# Patient Record
Sex: Male | Born: 1955 | Race: White | Hispanic: No | Marital: Married | Smoking: Former smoker
Health system: Southern US, Community
[De-identification: ages and names within clinical notes are randomized; demographics above are authoritative.]

## PROBLEM LIST (undated history)

## (undated) DIAGNOSIS — H04123 Dry eye syndrome of bilateral lacrimal glands: Secondary | ICD-10-CM

## (undated) DIAGNOSIS — E119 Type 2 diabetes mellitus without complications: Secondary | ICD-10-CM

## (undated) DIAGNOSIS — I10 Essential (primary) hypertension: Secondary | ICD-10-CM

## (undated) DIAGNOSIS — R3915 Urgency of urination: Secondary | ICD-10-CM

## (undated) DIAGNOSIS — N400 Enlarged prostate without lower urinary tract symptoms: Secondary | ICD-10-CM

## (undated) DIAGNOSIS — Z87442 Personal history of urinary calculi: Secondary | ICD-10-CM

## (undated) DIAGNOSIS — T4145XA Adverse effect of unspecified anesthetic, initial encounter: Secondary | ICD-10-CM

## (undated) DIAGNOSIS — G4730 Sleep apnea, unspecified: Secondary | ICD-10-CM

## (undated) DIAGNOSIS — M1990 Unspecified osteoarthritis, unspecified site: Secondary | ICD-10-CM

## (undated) DIAGNOSIS — H269 Unspecified cataract: Secondary | ICD-10-CM

## (undated) DIAGNOSIS — M2550 Pain in unspecified joint: Secondary | ICD-10-CM

## (undated) DIAGNOSIS — T8859XA Other complications of anesthesia, initial encounter: Secondary | ICD-10-CM

## (undated) DIAGNOSIS — R42 Dizziness and giddiness: Secondary | ICD-10-CM

## (undated) DIAGNOSIS — R350 Frequency of micturition: Secondary | ICD-10-CM

## (undated) DIAGNOSIS — Z8669 Personal history of other diseases of the nervous system and sense organs: Secondary | ICD-10-CM

## (undated) DIAGNOSIS — Z86010 Personal history of colonic polyps: Secondary | ICD-10-CM

## (undated) DIAGNOSIS — K8020 Calculus of gallbladder without cholecystitis without obstruction: Secondary | ICD-10-CM

## (undated) DIAGNOSIS — E785 Hyperlipidemia, unspecified: Secondary | ICD-10-CM

## (undated) DIAGNOSIS — K259 Gastric ulcer, unspecified as acute or chronic, without hemorrhage or perforation: Secondary | ICD-10-CM

## (undated) HISTORY — PX: KNEE ARTHROSCOPY: SUR90

## (undated) HISTORY — PX: OTHER SURGICAL HISTORY: SHX169

## (undated) HISTORY — PX: CYSTOSCOPY: SHX5120

## (undated) HISTORY — PX: ESOPHAGOGASTRODUODENOSCOPY: SHX1529

## (undated) HISTORY — PX: LITHOTRIPSY: SUR834

## (undated) HISTORY — PX: TRIGGER FINGER RELEASE: SHX641

## (undated) HISTORY — PX: SHOULDER ARTHROSCOPY: SHX128

## (undated) HISTORY — PX: COLONOSCOPY: SHX174

## (undated) HISTORY — PX: CARPAL TUNNEL RELEASE: SHX101

---

## 2004-10-09 ENCOUNTER — Ambulatory Visit: Payer: Self-pay | Admitting: Family Medicine

## 2004-10-21 ENCOUNTER — Ambulatory Visit: Payer: Self-pay | Admitting: Family Medicine

## 2004-11-22 ENCOUNTER — Ambulatory Visit (HOSPITAL_COMMUNITY): Admission: RE | Admit: 2004-11-22 | Discharge: 2004-11-22 | Payer: Self-pay | Admitting: Orthopedic Surgery

## 2005-09-08 ENCOUNTER — Ambulatory Visit (HOSPITAL_COMMUNITY): Admission: RE | Admit: 2005-09-08 | Discharge: 2005-09-08 | Payer: Self-pay | Admitting: Urology

## 2007-07-04 IMAGING — CR DG ABDOMEN 1V
2 series · 2 of 2 positions shown · non-contrast
Comparison: none

CLINICAL DATA: Patient with history of a left renal pelvic stone ? for lithotripsy.
 ABDOMEN ? 1 VIEW ? 09/08/05:
 No comparison.

[t abdomen supine (1 of 2)]
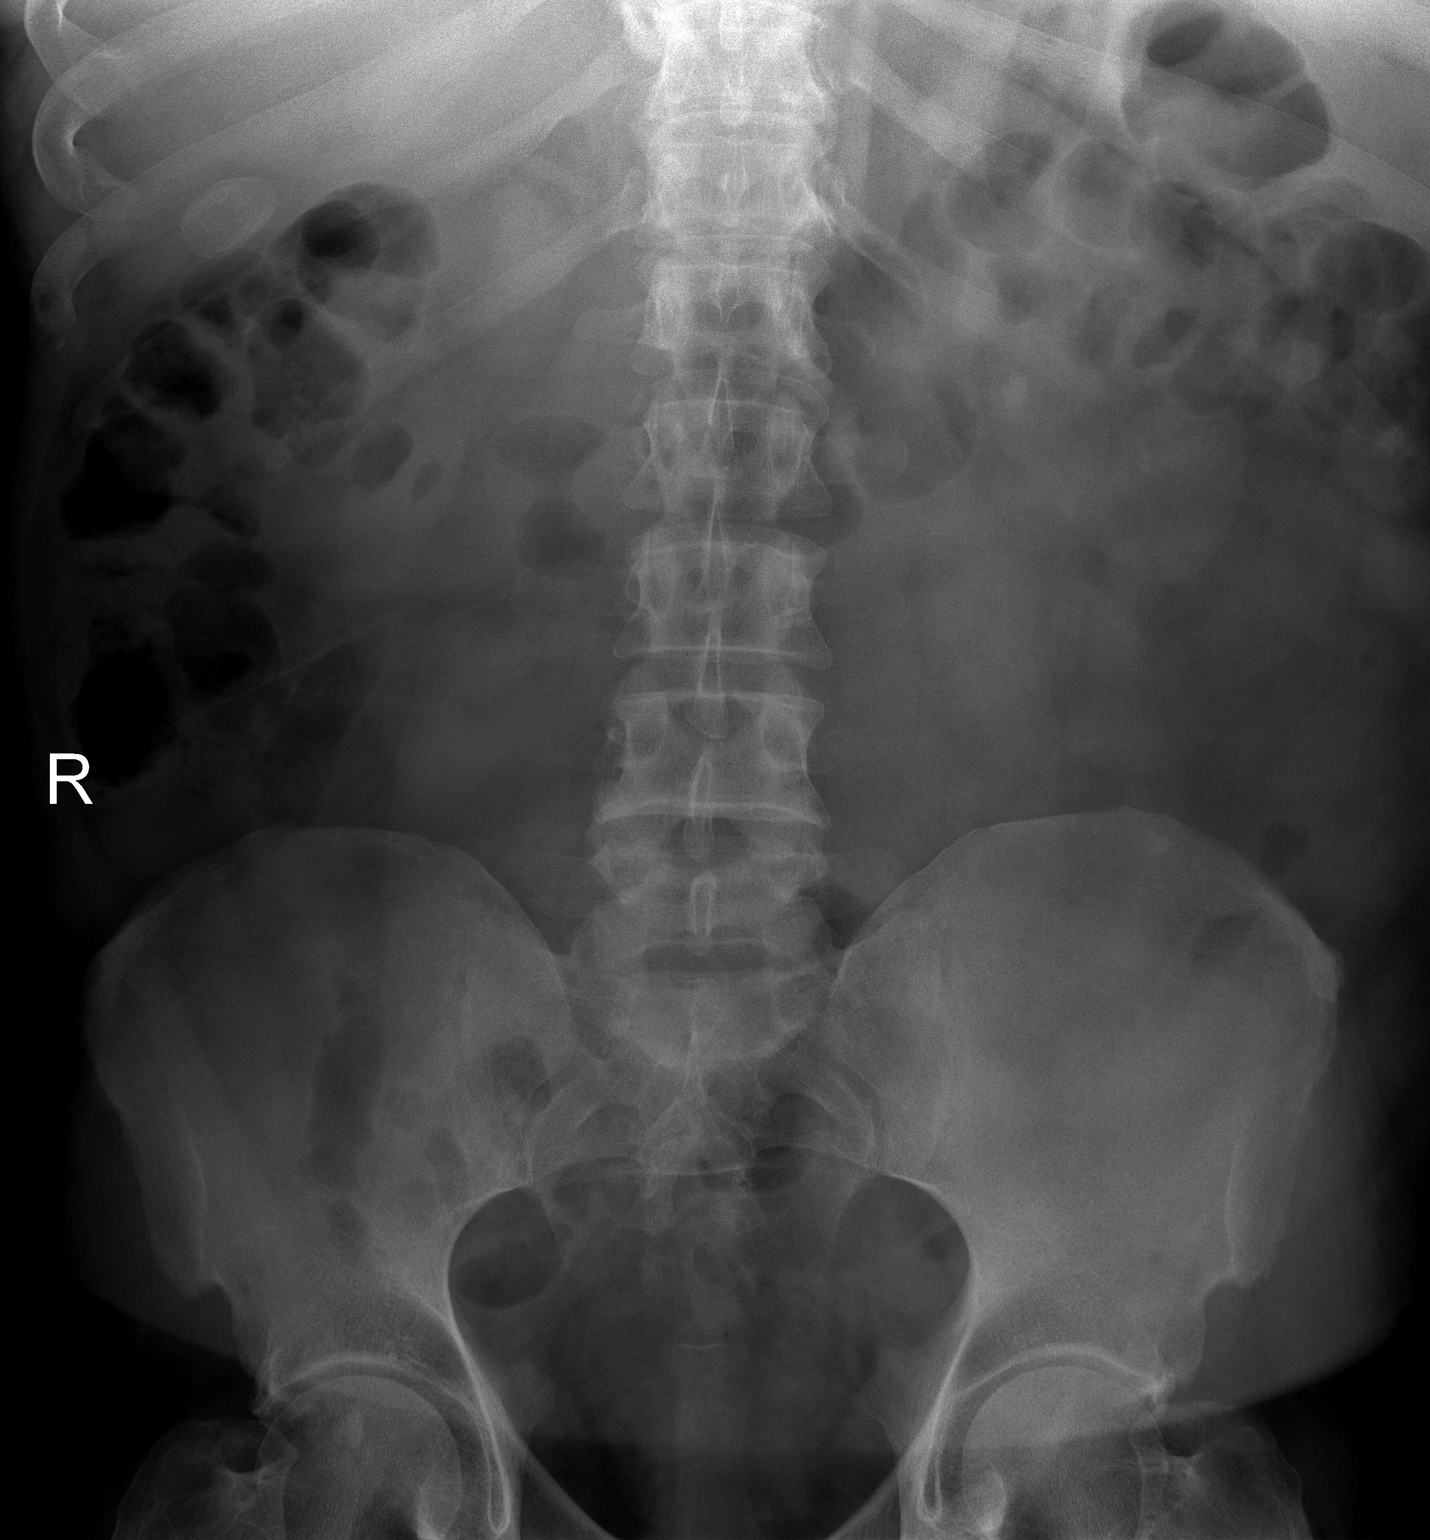

[t abdomen supine (2 of 2)]
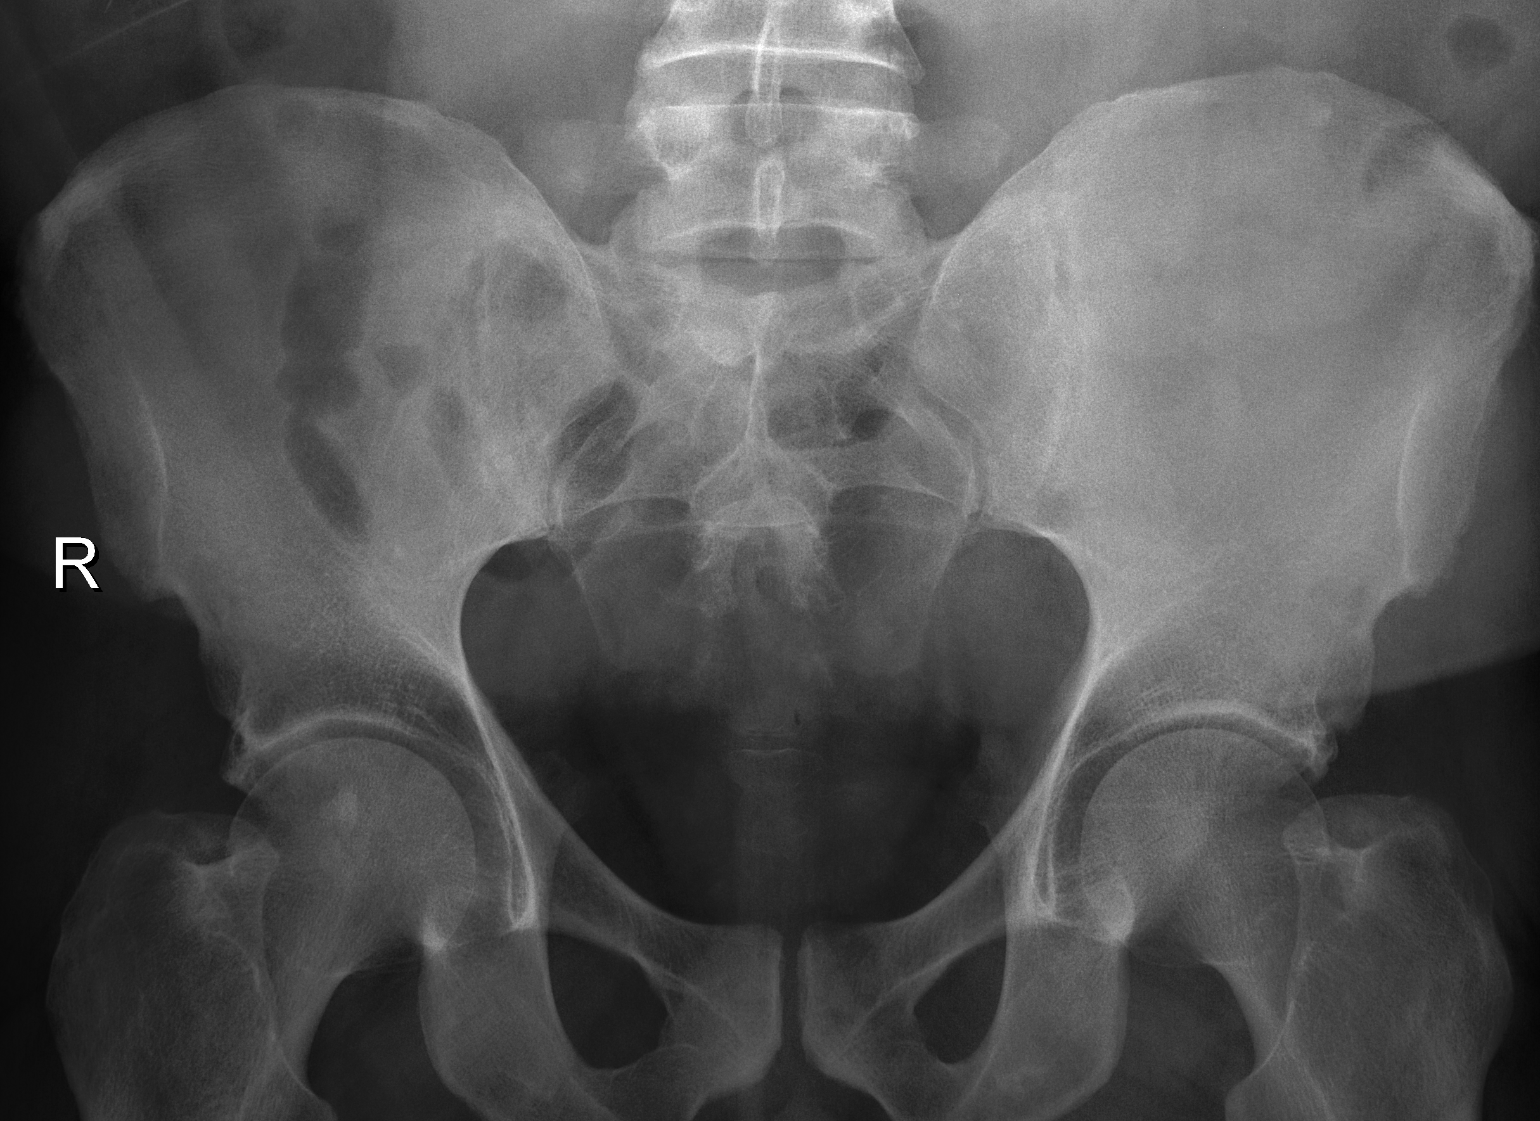

[2 of 2 positions shown; findings below may reference images not displayed]

FINDINGS: AP view of the abdomen reveals a 9 mm calculus in the region of the left renal pelvis.  There is also thought to be a 4 mm calculus in the lower pole on the left.  There is a 2.7 cm calcified gallstone.
IMPRESSION: Left renal calculi.  
 2.7 cm calcified gallstone.
 There is an apparent bone island involving the right femoral head.

## 2009-12-12 ENCOUNTER — Ambulatory Visit (HOSPITAL_BASED_OUTPATIENT_CLINIC_OR_DEPARTMENT_OTHER): Admission: RE | Admit: 2009-12-12 | Discharge: 2009-12-12 | Payer: Self-pay | Admitting: Orthopedic Surgery

## 2010-05-16 LAB — BASIC METABOLIC PANEL
BUN: 16 mg/dL (ref 6–23)
CO2: 30 mEq/L (ref 19–32)
Calcium: 9.3 mg/dL (ref 8.4–10.5)
Chloride: 105 mEq/L (ref 96–112)
Creatinine, Ser: 1.32 mg/dL (ref 0.4–1.5)
GFR calc Af Amer: >60 mL/min (ref >60)
GFR calc non Af Amer: 56 mL/min — ABNORMAL LOW (ref >60)
Glucose, Bld: 204 mg/dL — ABNORMAL HIGH (ref 70–99)
Potassium: 4.6 mEq/L (ref 3.5–5.1)
Sodium: 139 mEq/L (ref 135–145)

## 2010-05-16 LAB — POCT I-STAT, CHEM 8
BUN: 19 mg/dL (ref 6–23)
Calcium, Ion: 1.13 mmol/L (ref 1.12–1.32)
Chloride: 106 mEq/L (ref 96–112)
Creatinine, Ser: 1 mg/dL (ref 0.4–1.5)
Glucose, Bld: 264 mg/dL — ABNORMAL HIGH (ref 70–99)
HCT: 46 % (ref 39.0–52.0)
Hemoglobin: 15.6 g/dL (ref 13.0–17.0)
Potassium: 3.7 mEq/L (ref 3.5–5.1)
Sodium: 141 mEq/L (ref 135–145)
TCO2: 24 mmol/L (ref 0–100)

## 2010-05-16 LAB — GLUCOSE, CAPILLARY: Glucose-Capillary: 220 mg/dL — ABNORMAL HIGH (ref 70–99)

## 2013-03-21 ENCOUNTER — Encounter (HOSPITAL_COMMUNITY): Payer: Self-pay | Admitting: *Deleted

## 2013-03-22 ENCOUNTER — Encounter (HOSPITAL_COMMUNITY): Payer: Self-pay | Admitting: *Deleted

## 2013-03-22 ENCOUNTER — Encounter (HOSPITAL_COMMUNITY): Payer: Self-pay | Admitting: Pharmacy Technician

## 2013-03-23 ENCOUNTER — Other Ambulatory Visit: Payer: Self-pay | Admitting: Urology

## 2013-03-23 NOTE — H&P (Signed)
History of Present Illness     F/u     1-Nephrolithiasis - h/o ESWL and URS/stents. CT Hematuria Oct 2012 was performed which showed a 2 mm left UVJ stone and mild hydronephrosis. He also had stable LLP 3 mm stone. There was an endophytic right renal cyst stable since 2007 (five years) -- I reviewed all images and compared to April 2012 CT images. His BUN 31, Cr 1.2. He passed the left UVJ stone. He felt the stone pass.  -May 2013 - KUB - stable LLP stone. Normal bowel gas pattern.     2- BPH / PCa screening:   -Apr 2012 - started tamsulosin April 2012 for weak stream, frequency. He had a normal DRE and a PSA 0.7.   -May 2013 normal DRE, tapering off alpha blocker. PSA 0.68. PVR - 66 ml        Jan 2015   Interval Hx  For past few weeks patient has had left flank pain. He passed a 5 mm stone a few weeks ago and brought it with him. He passed other smaller stones. He went to ER two days ago with increased left flank pain. CT revealed a 10 mm left proximal ureteral stone (HU 900, SSD 12 cm, visible on scout) and hydro. I reviewed all the images on [facility]. No other stones.  Bun 18, cr 1.0, wbc 7.5    He is staying hydrated - eating and drinking. No fevers. He hasn't needed pain meds.    Past Medical History Problems  1. History of Arthritis (V13.4) 2. History of Gastric ulcer (531.90) 3. History of carpal tunnel syndrome (V12.49) 4. History of diabetes mellitus (V12.29) 5. History of esophageal reflux (V12.79) 6. History of hypercholesterolemia (V12.29) 7. History of hypertension (V12.59) 8. History of sleep apnea (V13.89)  Surgical History Problems  1. History of Cystoscopy With Ureteroscopy With Removal Of Calculus 2. History of Hand Surgery 3. History of Hand Surgery 4. History of Lithotripsy  Current Meds 1. Aspirin 81 MG Oral Tablet;  Therapy: (Recorded:23Apr2012) to Recorded 2. Fish Oil CAPS;  Therapy: (Recorded:19Jan2015) to Recorded 3. Meloxicam 15 MG  Oral Tablet;  Therapy: (Recorded:19Jan2015) to Recorded 4. Metoprolol Succinate ER 25 MG Oral Tablet Extended Release 24 Hour;  Therapy: (Recorded:19Jan2015) to Recorded 5. Multi-Vitamin TABS;  Therapy: (Recorded:19Jan2015) to Recorded 6. Niacin 500 MG Oral Tablet;  Therapy: (Recorded:19Jan2015) to Recorded 7. NovoLIN 70/30 (70-30) 100 UNIT/ML Subcutaneous Suspension;  Therapy: (Recorded:23Apr2012) to Recorded  Allergies Medication  1. Penicillins  Family History Problems  1. Family history of Family Health Status Number Of Children   1 son   1 daughter 2. Family history of Nephrolithiasis : Father 3. Family history of Stroke Syndrome (V17.1) : Mother  Social History Problems  1. Alcohol Use   1-2 beers a week 2. Caffeine Use   4 a day 3. Former smoker (V15.82)   smoked 1 ppd x 24 years 4. Marital History - Currently Married 5. Occupation:   Dispensing optician  Review of Systems Constitutional, cardiovascular, pulmonary, gastrointestinal and psychiatric system(s) were reviewed and pertinent findings if present are noted.    Vitals Vital Signs [Data Includes: Last 1 Day]  Recorded: 19Jan2015 11:03AM  Blood Pressure: 174 / 97 Temperature: 98.6 F Heart Rate: 78  Physical Exam Constitutional: Well nourished and well developed . No acute distress.  ENT:. The ears and nose are normal in appearance.  Neck: The appearance of the neck is normal and no neck mass is present.  Pulmonary: No  respiratory distress and normal respiratory rhythm and effort.  Cardiovascular: Heart rate and rhythm are normal . No peripheral edema.  Abdomen: The abdomen is soft and nontender. No masses are palpated. No CVA tenderness. No hernias are palpable. No hepatosplenomegaly noted.  Lymphatics: The femoral and inguinal nodes are not enlarged or tender.  Skin: Normal skin turgor, no visible rash and no visible skin lesions.  Neuro/Psych:. Mood and affect are appropriate.    Results/Data Urine  [Data Includes: Last 1 Day]   [id number]  COLOR YELLOW   APPEARANCE CLEAR   SPECIFIC GRAVITY 1.015   pH 6.5   GLUCOSE > 1000 mg/dL  BILIRUBIN NEG   KETONE NEG mg/dL  BLOOD MOD   PROTEIN NEG mg/dL  UROBILINOGEN 1 mg/dL  NITRITE NEG   LEUKOCYTE ESTERASE NEG   SQUAMOUS EPITHELIAL/HPF RARE   WBC 3-6 WBC/hpf  RBC 7-10 RBC/hpf  BACTERIA RARE   CRYSTALS NONE SEEN   CASTS NONE SEEN    The following images/tracing/specimen were independently visualized: Marland Kitchen    Assessment Assessed  1. Ureteral stone (592.1)  Plan Health Maintenance  1. UA With REFLEX; [Do Not Release]; Status:Complete;   Done: [id number] 10:42AM Ureteral stone  2. Follow-up Schedule Surgery Office  Follow-up  Status: Hold For - Appointment   Requested for: [id number] 3. URINE CULTURE; Status:Hold For - Specimen/Data Collection,Appointment; Requested  EBX:[id number];   Discussion/Summary Discussed CT imaging - nature R/B of left ESWL, URS/stent or continued MET. He wants to set up ESWL. He's had ESWL before. In fact he stopped his ASA, meloxicam two days ago to prepare. We discussed to call or go to ER if he develops uncontrolled pain, F/C/N/V etc. Urine sent for Cx as a precaution.       Signatures Electronically signed by : Festus Aloe, M.D.; Mar 21 2013 11:40AM EST

## 2013-03-24 ENCOUNTER — Encounter (HOSPITAL_COMMUNITY): Payer: Self-pay | Admitting: *Deleted

## 2013-03-24 ENCOUNTER — Encounter (HOSPITAL_COMMUNITY)
Admission: RE | Disposition: A | Discharge status: Home / Self Care | Payer: Self-pay | Source: Ambulatory Visit | Attending: Urology

## 2013-03-24 ENCOUNTER — Ambulatory Visit (HOSPITAL_COMMUNITY)
Admission: RE | Admit: 2013-03-24 | Discharge: 2013-03-24 | Disposition: A | Discharge status: Home / Self Care | Payer: BC Managed Care – PPO | Source: Ambulatory Visit | Attending: Urology | Admitting: Urology

## 2013-03-24 ENCOUNTER — Ambulatory Visit (HOSPITAL_COMMUNITY): Payer: BC Managed Care – PPO

## 2013-03-24 DIAGNOSIS — N200 Calculus of kidney: Secondary | ICD-10-CM | POA: Insufficient documentation

## 2013-03-24 DIAGNOSIS — Z87891 Personal history of nicotine dependence: Secondary | ICD-10-CM | POA: Insufficient documentation

## 2013-03-24 DIAGNOSIS — Z7901 Long term (current) use of anticoagulants: Secondary | ICD-10-CM | POA: Insufficient documentation

## 2013-03-24 DIAGNOSIS — Z7982 Long term (current) use of aspirin: Secondary | ICD-10-CM | POA: Insufficient documentation

## 2013-03-24 DIAGNOSIS — G4730 Sleep apnea, unspecified: Secondary | ICD-10-CM | POA: Insufficient documentation

## 2013-03-24 DIAGNOSIS — K219 Gastro-esophageal reflux disease without esophagitis: Secondary | ICD-10-CM | POA: Insufficient documentation

## 2013-03-24 DIAGNOSIS — Z794 Long term (current) use of insulin: Secondary | ICD-10-CM | POA: Insufficient documentation

## 2013-03-24 DIAGNOSIS — E7800 Pure hypercholesterolemia, unspecified: Secondary | ICD-10-CM | POA: Insufficient documentation

## 2013-03-24 DIAGNOSIS — Z79899 Other long term (current) drug therapy: Secondary | ICD-10-CM | POA: Insufficient documentation

## 2013-03-24 DIAGNOSIS — I10 Essential (primary) hypertension: Secondary | ICD-10-CM | POA: Insufficient documentation

## 2013-03-24 DIAGNOSIS — E119 Type 2 diabetes mellitus without complications: Secondary | ICD-10-CM | POA: Insufficient documentation

## 2013-03-24 HISTORY — DX: Sleep apnea, unspecified: G47.30

## 2013-03-24 HISTORY — DX: Essential (primary) hypertension: I10

## 2013-03-24 HISTORY — DX: Unspecified osteoarthritis, unspecified site: M19.90

## 2013-03-24 HISTORY — DX: Type 2 diabetes mellitus without complications: E11.9

## 2013-03-24 LAB — GLUCOSE, CAPILLARY
GLUCOSE-CAPILLARY: 268 mg/dL — AB (ref 70–99)
Glucose-Capillary: 194 mg/dL — ABNORMAL HIGH (ref 70–99)
Glucose-Capillary: 154 mg/dL — ABNORMAL HIGH (ref 70–99)

## 2013-03-24 SURGERY — LITHOTRIPSY, ESWL
Anesthesia: LOCAL | Laterality: Left

## 2013-03-24 MED ORDER — DIPHENHYDRAMINE HCL 25 MG PO CAPS
25.0000 mg | ORAL_CAPSULE | ORAL | Status: AC
  Administered 2013-03-24: 25 mg via ORAL
  Filled 2013-03-24: qty 1

## 2013-03-24 MED ORDER — MELOXICAM 15 MG PO TABS: 15.0000 mg | ORAL_TABLET | Freq: Every day | ORAL | Status: AC

## 2013-03-24 MED ORDER — ASPIRIN EC 81 MG PO TBEC: 81.0000 mg | DELAYED_RELEASE_TABLET | Freq: Every day | ORAL | Status: DC

## 2013-03-24 MED ORDER — SODIUM CHLORIDE 0.9 % IV SOLN
INTRAVENOUS | Status: DC
  Administered 2013-03-24: 10:00:00 via INTRAVENOUS

## 2013-03-24 MED ORDER — CIPROFLOXACIN HCL 500 MG PO TABS
500.0000 mg | ORAL_TABLET | ORAL | Status: AC
  Administered 2013-03-24: 500 mg via ORAL
  Filled 2013-03-24: qty 1

## 2013-03-24 MED ORDER — OXYCODONE-ACETAMINOPHEN 5-325 MG PO TABS: 1.0000 | ORAL_TABLET | ORAL | Status: DC | PRN

## 2013-03-24 MED ORDER — CIPROFLOXACIN HCL 500 MG PO TABS: 500.0000 mg | ORAL_TABLET | Freq: Two times a day (BID) | ORAL | Status: DC

## 2013-03-24 MED ORDER — DIAZEPAM 5 MG PO TABS
10.0000 mg | ORAL_TABLET | ORAL | Status: AC
  Administered 2013-03-24: 10 mg via ORAL
  Filled 2013-03-24: qty 2

## 2013-03-24 MED ORDER — DEXTROSE-NACL 5-0.45 % IV SOLN
INTRAVENOUS | Status: DC
  Administered 2013-03-24: 09:00:00 via INTRAVENOUS

## 2013-03-24 NOTE — Op Note (Signed)
See [facility] op note

## 2013-03-24 NOTE — Progress Notes (Signed)
Patient was brought back from mobile lithotripsy unit as Dr Junious Silk called to surgery for emergency. RN explains to pt that estimated time of ESWL will be between 2-4 PM today. He is aggravated; however, he understands the rationale for having to wait. Emotional support given.

## 2013-03-24 NOTE — Progress Notes (Signed)
Left flank side pink circular area, red at center, post lithotripsy

## 2013-03-24 NOTE — Discharge Instructions (Signed)
Please follow [facility] d/c instructions.

## 2013-03-24 NOTE — Interval H&P Note (Signed)
History and Physical Interval Note:  03/24/2013 3:26 PM  Austin Robertson  has presented today for surgery, with the diagnosis of LEFT URETERAL STONE  The various methods of treatment have been discussed with the patient and family. After consideration of risks, benefits and other options for treatment, the patient has consented to  Procedure(s): LEFT EXTRACORPOREAL SHOCK WAVE LITHOTRIPSY (ESWL) (Left) as a surgical intervention .  The patient's history has been reviewed, patient examined, no change in status, stable for surgery.  I have reviewed the patient's chart and labs.  Questions were answered to the patient's satisfaction.  Pt has been well. No fever, dysuria. No stone passage. Stone remains left prox ureter on KUB.    Fredricka Bonine

## 2013-04-26 ENCOUNTER — Encounter (HOSPITAL_COMMUNITY): Payer: Self-pay | Admitting: Pharmacy Technician

## 2013-04-27 ENCOUNTER — Encounter (HOSPITAL_COMMUNITY): Payer: Self-pay

## 2013-04-27 ENCOUNTER — Encounter (HOSPITAL_COMMUNITY)
Admission: RE | Admit: 2013-04-27 | Discharge: 2013-04-27 | Disposition: A | Discharge status: Home / Self Care | Payer: BC Managed Care – PPO | Source: Ambulatory Visit | Attending: Orthopedic Surgery | Admitting: Orthopedic Surgery

## 2013-04-27 ENCOUNTER — Encounter (HOSPITAL_COMMUNITY)
Admission: RE | Admit: 2013-04-27 | Discharge: 2013-04-27 | Disposition: A | Discharge status: Home / Self Care | Payer: BC Managed Care – PPO | Source: Ambulatory Visit | Attending: Anesthesiology | Admitting: Anesthesiology

## 2013-04-27 DIAGNOSIS — Z01812 Encounter for preprocedural laboratory examination: Secondary | ICD-10-CM | POA: Insufficient documentation

## 2013-04-27 DIAGNOSIS — Z01810 Encounter for preprocedural cardiovascular examination: Secondary | ICD-10-CM | POA: Insufficient documentation

## 2013-04-27 DIAGNOSIS — Z01818 Encounter for other preprocedural examination: Secondary | ICD-10-CM | POA: Insufficient documentation

## 2013-04-27 HISTORY — DX: Pain in unspecified joint: M25.50

## 2013-04-27 HISTORY — DX: Gastric ulcer, unspecified as acute or chronic, without hemorrhage or perforation: K25.9

## 2013-04-27 HISTORY — DX: Calculus of gallbladder without cholecystitis without obstruction: K80.20

## 2013-04-27 HISTORY — DX: Other complications of anesthesia, initial encounter: T88.59XA

## 2013-04-27 HISTORY — DX: Personal history of urinary calculi: Z87.442

## 2013-04-27 HISTORY — DX: Personal history of colonic polyps: Z86.010

## 2013-04-27 HISTORY — DX: Dry eye syndrome of bilateral lacrimal glands: H04.123

## 2013-04-27 HISTORY — DX: Hyperlipidemia, unspecified: E78.5

## 2013-04-27 HISTORY — DX: Adverse effect of unspecified anesthetic, initial encounter: T41.45XA

## 2013-04-27 HISTORY — DX: Frequency of micturition: R35.0

## 2013-04-27 HISTORY — DX: Benign prostatic hyperplasia without lower urinary tract symptoms: N40.0

## 2013-04-27 HISTORY — DX: Personal history of other diseases of the nervous system and sense organs: Z86.69

## 2013-04-27 HISTORY — DX: Unspecified cataract: H26.9

## 2013-04-27 HISTORY — DX: Urgency of urination: R39.15

## 2013-04-27 HISTORY — DX: Dizziness and giddiness: R42

## 2013-04-27 LAB — CBC WITH DIFFERENTIAL/PLATELET
Band Neutrophils: 0 % (ref 0–10)
Basophils Absolute: 0 10*3/uL (ref 0.0–0.1)
Basophils Relative: 0 % (ref 0–1)
Blasts: 0 %
EOS ABS: 0.1 10*3/uL (ref 0.0–0.7)
EOS PCT: 2 % (ref 0–5)
HCT: 44.4 % (ref 39.0–52.0)
Hemoglobin: 16.1 g/dL (ref 13.0–17.0)
Lymphocytes Relative: 22 % (ref 12–46)
Lymphs Abs: 1.1 10*3/uL (ref 0.7–4.0)
MCH: 30.1 pg (ref 26.0–34.0)
MCHC: 36.3 g/dL — ABNORMAL HIGH (ref 30.0–36.0)
MCV: 83.1 fL (ref 78.0–100.0)
MYELOCYTES: 1 %
Metamyelocytes Relative: 0 %
Monocytes Absolute: 0.4 10*3/uL (ref 0.1–1.0)
Monocytes Relative: 7 % (ref 3–12)
NEUTROS ABS: 3.5 10*3/uL (ref 1.7–7.7)
NEUTROS PCT: 68 % (ref 43–77)
Platelets: 102 10*3/uL — ABNORMAL LOW (ref 150–400)
Promyelocytes Absolute: 0 %
RBC: 5.34 MIL/uL (ref 4.22–5.81)
RDW: 13.3 % (ref 11.5–15.5)
WBC: 5.1 10*3/uL (ref 4.0–10.5)
nRBC: 0 /100 WBC

## 2013-04-27 LAB — COMPREHENSIVE METABOLIC PANEL
ALT: 76 U/L — ABNORMAL HIGH (ref 0–53)
AST: 58 U/L — ABNORMAL HIGH (ref 0–37)
Albumin: 3.4 g/dL — ABNORMAL LOW (ref 3.5–5.2)
Alkaline Phosphatase: 150 U/L — ABNORMAL HIGH (ref 39–117)
BILIRUBIN TOTAL: 0.7 mg/dL (ref 0.3–1.2)
BUN: 16 mg/dL (ref 6–23)
CHLORIDE: 100 meq/L (ref 96–112)
CO2: 25 mEq/L (ref 19–32)
Calcium: 9.6 mg/dL (ref 8.4–10.5)
Creatinine, Ser: 0.9 mg/dL (ref 0.50–1.35)
GFR calc non Af Amer: >90 mL/min (ref >90)
Glucose, Bld: 322 mg/dL — ABNORMAL HIGH (ref 70–99)
Potassium: 4.8 mEq/L (ref 3.7–5.3)
Sodium: 138 mEq/L (ref 137–147)
Total Protein: 7.1 g/dL (ref 6.0–8.3)

## 2013-04-27 LAB — PROTIME-INR
INR: 1.05 (ref 0.00–1.49)
PROTHROMBIN TIME: 13.5 s (ref 11.6–15.2)

## 2013-04-27 LAB — APTT: aPTT: 27 seconds (ref 24–37)

## 2013-04-27 MED ORDER — CHLORHEXIDINE GLUCONATE 4 % EX LIQD: 60.0000 mL | Freq: Once | CUTANEOUS | Status: DC

## 2013-04-27 NOTE — Progress Notes (Signed)
Pt doesn't have a cardiologist  Stress test in epic under media tab  From 2011  Denies ever having an echo or heart cath  [facility] is [place] and sees  Laverna Peace NP  Denies ekg or cxr in past yr

## 2013-04-27 NOTE — Pre-Procedure Instructions (Signed)
Austin Robertson  04/27/2013   Your procedure is scheduled on:  Thurs, Mar 5 @ 1:00 PM  Report to [name] Stay Entrance A  at 11:00 AM.  Call this number if you have problems the morning of surgery: [phone]   Remember:   Do not eat food or drink liquids after midnight.   Take these medicines the morning of surgery with A SIP OF WATER: Pain Pill(if needed) and Metoprolol(Lopressor)               Stop taking your Aspirin,Fish Oil,and Meloxicam. No Goody's,BC's,Aleve,Ibuprofen,or any Herbal Medications   Do not wear jewelry  Do not wear lotions, powders, or colognes.   Men may shave face and neck.  Do not bring valuables to the hospital.  [name] is not responsible                  for any belongings or valuables.               Contacts, dentures or bridgework may not be worn into surgery.  Leave suitcase in the car. After surgery it may be brought to your room.  For patients admitted to the hospital, discharge time is determined by your                treatment team.               Patients discharged the day of surgery will not be allowed to drive  home.    Special Instructions:  [name] - Preparing for Surgery  Before surgery, you can play an important role.  Because skin is not sterile, your skin needs to be as free of germs as possible.  You can reduce the number of germs on you skin by washing with CHG (chlorahexidine gluconate) soap before surgery.  CHG is an antiseptic cleaner which kills germs and bonds with the skin to continue killing germs even after washing.  Please DO NOT use if you have an allergy to CHG or antibacterial soaps.  If your skin becomes reddened/irritated stop using the CHG and inform your nurse when you arrive at Short Stay.  Do not shave (including legs and underarms) for at least 48 hours prior to the first CHG shower.  You may shave your face.  Please follow these instructions carefully:   1.  Shower with CHG Soap the night before  surgery and the                                morning of Surgery.  2.  If you choose to wash your hair, wash your hair first as usual with your       normal shampoo.  3.  After you shampoo, rinse your hair and body thoroughly to remove the                      Shampoo.  4.  Use CHG as you would any other liquid soap.  You can apply chg directly       to the skin and wash gently with scrungie or a clean washcloth.  5.  Apply the CHG Soap to your body ONLY FROM THE NECK DOWN.        Do not use on open wounds or open sores.  Avoid contact with your eyes,       ears, mouth and genitals (private parts).  Wash genitals (private parts)       with your normal soap.  6.  Wash thoroughly, paying special attention to the area where your surgery        will be performed.  7.  Thoroughly rinse your body with warm water from the neck down.  8.  DO NOT shower/wash with your normal soap after using and rinsing off       the CHG Soap.  9.  Pat yourself dry with a clean towel.            10.  Wear clean pajamas.            11.  Place clean sheets on your bed the night of your first shower and do not        sleep with pets.  Day of Surgery  Do not apply any lotions/deoderants the morning of surgery.  Please wear clean clothes to the hospital/surgery center.     Please read over the following fact sheets that you were given: Pain Booklet, Coughing and Deep Breathing and Surgical Site Infection Prevention

## 2013-04-27 NOTE — Progress Notes (Signed)
Average fasting blood sugar 250(pt states elevated since kidney stones)

## 2013-04-28 ENCOUNTER — Encounter (HOSPITAL_COMMUNITY): Payer: Self-pay

## 2013-04-28 NOTE — Progress Notes (Signed)
Anesthesia chart review: Patient is a 58 year old male scheduled for left shoulder arthroscopy with subacromial decompression, distal clavicle resection and rotator cuff repair on 05/05/2013 by Dr. Onnie Graham.    History includes former smoker, DM2 on insulin, HTN, HLD, OSA with CPAP use, BPH, migraines, nephrolithiasis, gastric ulcers, cataract extracts, vertigo, BMI is 38.49 consistent with obesity. For anesthesia history, he reported that he woke up during basket retrieval for kidney stones many years ago.  His anesthesiologist told him that he was resistant and that it took more anesthesia to get him properly anesthetized. PCP is Laverna Peace, NP with [facility].   EKG on 04/27/13 showed NSR, possible LAE, non-specific T wave abnormality.  Nuclear stress test on 04/23/09 (done at [facility]; scanned under Media Tab, Correspondence 12/12/09) showed: No evidence of ischemia. Preserved wall motion in thickening with ejection fraction calculated at 54%. No ST changes, no chest pain, and no dysrhythmia Dearing exercise portion. Uneventful what the scan injection and monitoring.  CXR on 04/27/13 showed no active cardiopulmonary disease.  Preoperative labs noted.  Cr 0.90. Glucose was 322.  AST/ALT 58/76. PLT count 102K (no comparison PLT count at present). H/H 16.1/44.4. PT/PTT WNL. He reports his last A1C was ~ 8.5.  His sugars have been higher overall since he had pain from his kidney stones in 03/2013 and is now having shoulder pain. His last cortisone injection was at least a month ago. His glucose last night was 155 and was 230 this morning.  He is compliant with his insulin regimen.  He will be holding his 70/30 insulin on the morning of surgery.  He thinks his glucose was reasonable on arrival when he had his lithotripsy in 03/2013, but did say that they started him on dextrose IVF and later had hyperglycemia because his procedure got delayed due to an emergency.  I think that if his  glucose readings remain around his current levels he will be okay to proceed as planned, but he was encouraged to continue out-patient follow-up with his PCP to strive for more optimal long-term DM control.    [facility] Short Stay Center/Anesthesiology Phone [phone] 04/28/2013 12:59 PM

## 2013-05-04 MED ORDER — LACTATED RINGERS IV SOLN
INTRAVENOUS | Status: DC
  Administered 2013-05-05 (×2): via INTRAVENOUS

## 2013-05-04 MED ORDER — CEFAZOLIN SODIUM-DEXTROSE 2-3 GM-% IV SOLR
2.0000 g | INTRAVENOUS | Status: AC
  Administered 2013-05-05: 2 g via INTRAVENOUS

## 2013-05-05 ENCOUNTER — Encounter (HOSPITAL_COMMUNITY): Payer: BC Managed Care – PPO | Admitting: Vascular Surgery

## 2013-05-05 ENCOUNTER — Ambulatory Visit (HOSPITAL_COMMUNITY)
Admission: RE | Admit: 2013-05-05 | Discharge: 2013-05-05 | Disposition: A | Discharge status: Home / Self Care | Payer: BC Managed Care – PPO | Source: Ambulatory Visit | Attending: Orthopedic Surgery | Admitting: Orthopedic Surgery

## 2013-05-05 ENCOUNTER — Encounter (HOSPITAL_COMMUNITY): Payer: Self-pay | Admitting: *Deleted

## 2013-05-05 ENCOUNTER — Encounter (HOSPITAL_COMMUNITY)
Admission: RE | Disposition: A | Discharge status: Home / Self Care | Payer: Self-pay | Source: Ambulatory Visit | Attending: Orthopedic Surgery

## 2013-05-05 ENCOUNTER — Ambulatory Visit (HOSPITAL_COMMUNITY): Payer: BC Managed Care – PPO | Admitting: Anesthesiology

## 2013-05-05 DIAGNOSIS — I509 Heart failure, unspecified: Secondary | ICD-10-CM | POA: Insufficient documentation

## 2013-05-05 DIAGNOSIS — Z860100 Personal history of colon polyps, unspecified: Secondary | ICD-10-CM | POA: Insufficient documentation

## 2013-05-05 DIAGNOSIS — Z87891 Personal history of nicotine dependence: Secondary | ICD-10-CM | POA: Insufficient documentation

## 2013-05-05 DIAGNOSIS — Z794 Long term (current) use of insulin: Secondary | ICD-10-CM | POA: Insufficient documentation

## 2013-05-05 DIAGNOSIS — Z87442 Personal history of urinary calculi: Secondary | ICD-10-CM | POA: Insufficient documentation

## 2013-05-05 DIAGNOSIS — E119 Type 2 diabetes mellitus without complications: Secondary | ICD-10-CM | POA: Insufficient documentation

## 2013-05-05 DIAGNOSIS — M249 Joint derangement, unspecified: Secondary | ICD-10-CM | POA: Insufficient documentation

## 2013-05-05 DIAGNOSIS — M25819 Other specified joint disorders, unspecified shoulder: Secondary | ICD-10-CM | POA: Insufficient documentation

## 2013-05-05 DIAGNOSIS — I10 Essential (primary) hypertension: Secondary | ICD-10-CM | POA: Insufficient documentation

## 2013-05-05 DIAGNOSIS — Z79899 Other long term (current) drug therapy: Secondary | ICD-10-CM | POA: Insufficient documentation

## 2013-05-05 DIAGNOSIS — H269 Unspecified cataract: Secondary | ICD-10-CM | POA: Insufficient documentation

## 2013-05-05 DIAGNOSIS — M19019 Primary osteoarthritis, unspecified shoulder: Secondary | ICD-10-CM | POA: Insufficient documentation

## 2013-05-05 DIAGNOSIS — I252 Old myocardial infarction: Secondary | ICD-10-CM | POA: Insufficient documentation

## 2013-05-05 DIAGNOSIS — M719 Bursopathy, unspecified: Secondary | ICD-10-CM | POA: Insufficient documentation

## 2013-05-05 DIAGNOSIS — M7580 Other shoulder lesions, unspecified shoulder: Principal | ICD-10-CM

## 2013-05-05 DIAGNOSIS — E785 Hyperlipidemia, unspecified: Secondary | ICD-10-CM | POA: Insufficient documentation

## 2013-05-05 DIAGNOSIS — G4730 Sleep apnea, unspecified: Secondary | ICD-10-CM | POA: Insufficient documentation

## 2013-05-05 DIAGNOSIS — Z7982 Long term (current) use of aspirin: Secondary | ICD-10-CM | POA: Insufficient documentation

## 2013-05-05 DIAGNOSIS — M67919 Unspecified disorder of synovium and tendon, unspecified shoulder: Secondary | ICD-10-CM | POA: Insufficient documentation

## 2013-05-05 DIAGNOSIS — I209 Angina pectoris, unspecified: Secondary | ICD-10-CM | POA: Insufficient documentation

## 2013-05-05 HISTORY — PX: SHOULDER ARTHROSCOPY WITH ROTATOR CUFF REPAIR AND SUBACROMIAL DECOMPRESSION: SHX5686

## 2013-05-05 LAB — GLUCOSE, CAPILLARY
GLUCOSE-CAPILLARY: 212 mg/dL — AB (ref 70–99)
Glucose-Capillary: 227 mg/dL — ABNORMAL HIGH (ref 70–99)
Glucose-Capillary: 161 mg/dL — ABNORMAL HIGH (ref 70–99)

## 2013-05-05 SURGERY — SHOULDER ARTHROSCOPY WITH ROTATOR CUFF REPAIR AND SUBACROMIAL DECOMPRESSION
Anesthesia: General | Site: Shoulder | Laterality: Left

## 2013-05-05 MED ORDER — PROPOFOL 10 MG/ML IV BOLUS
INTRAVENOUS | Status: DC | PRN
  Administered 2013-05-05: 200 mg via INTRAVENOUS

## 2013-05-05 MED ORDER — ROCURONIUM BROMIDE 50 MG/5ML IV SOLN
INTRAVENOUS | Status: AC
  Filled 2013-05-05: qty 1

## 2013-05-05 MED ORDER — MIDAZOLAM HCL 2 MG/2ML IJ SOLN
INTRAMUSCULAR | Status: AC
  Administered 2013-05-05: 2 mg
  Filled 2013-05-05: qty 2

## 2013-05-05 MED ORDER — SUCCINYLCHOLINE CHLORIDE 20 MG/ML IJ SOLN
INTRAMUSCULAR | Status: DC | PRN
  Administered 2013-05-05: 60 mg via INTRAVENOUS

## 2013-05-05 MED ORDER — FENTANYL CITRATE 0.05 MG/ML IJ SOLN
INTRAMUSCULAR | Status: DC | PRN
  Administered 2013-05-05: 50 ug via INTRAVENOUS
  Administered 2013-05-05: 100 ug via INTRAVENOUS

## 2013-05-05 MED ORDER — ACETAMINOPHEN 160 MG/5ML PO SOLN
325.0000 mg | ORAL | Status: DC | PRN
  Filled 2013-05-05: qty 20.3

## 2013-05-05 MED ORDER — ROPIVACAINE HCL 5 MG/ML IJ SOLN
INTRAMUSCULAR | Status: DC | PRN
  Administered 2013-05-05: 20 mL via PERINEURAL

## 2013-05-05 MED ORDER — HYDROMORPHONE HCL 2 MG PO TABS: 2.0000 mg | ORAL_TABLET | ORAL | Status: DC | PRN

## 2013-05-05 MED ORDER — NAPROXEN 500 MG PO TABS: 500.0000 mg | ORAL_TABLET | Freq: Two times a day (BID) | ORAL | Status: DC

## 2013-05-05 MED ORDER — ROCURONIUM BROMIDE 100 MG/10ML IV SOLN
INTRAVENOUS | Status: DC | PRN
  Administered 2013-05-05 (×2): 10 mg via INTRAVENOUS

## 2013-05-05 MED ORDER — ONDANSETRON HCL 4 MG/2ML IJ SOLN
INTRAMUSCULAR | Status: DC | PRN
  Administered 2013-05-05: 4 mg via INTRAVENOUS

## 2013-05-05 MED ORDER — LIDOCAINE HCL (CARDIAC) 20 MG/ML IV SOLN
INTRAVENOUS | Status: AC
  Filled 2013-05-05: qty 5

## 2013-05-05 MED ORDER — FENTANYL CITRATE 0.05 MG/ML IJ SOLN
INTRAMUSCULAR | Status: AC
  Filled 2013-05-05: qty 5

## 2013-05-05 MED ORDER — FENTANYL CITRATE 0.05 MG/ML IJ SOLN
25.0000 ug | INTRAMUSCULAR | Status: DC | PRN
  Administered 2013-05-05: 50 ug via INTRAVENOUS

## 2013-05-05 MED ORDER — LIDOCAINE HCL (CARDIAC) 20 MG/ML IV SOLN
INTRAVENOUS | Status: DC | PRN
  Administered 2013-05-05: 100 mg via INTRAVENOUS

## 2013-05-05 MED ORDER — ONDANSETRON HCL 4 MG/2ML IJ SOLN
INTRAMUSCULAR | Status: AC
  Filled 2013-05-05: qty 2

## 2013-05-05 MED ORDER — NEOSTIGMINE METHYLSULFATE 1 MG/ML IJ SOLN
INTRAMUSCULAR | Status: DC | PRN
  Administered 2013-05-05: 3 mg via INTRAVENOUS

## 2013-05-05 MED ORDER — GLYCOPYRROLATE 0.2 MG/ML IJ SOLN
INTRAMUSCULAR | Status: DC | PRN
  Administered 2013-05-05: 0.4 mg via INTRAVENOUS
  Administered 2013-05-05: 0.2 mg via INTRAVENOUS

## 2013-05-05 MED ORDER — FENTANYL CITRATE 0.05 MG/ML IJ SOLN
INTRAMUSCULAR | Status: AC
  Administered 2013-05-05: 50 ug via INTRAVENOUS
  Filled 2013-05-05: qty 2

## 2013-05-05 MED ORDER — PROPOFOL 10 MG/ML IV BOLUS
INTRAVENOUS | Status: AC
  Filled 2013-05-05: qty 20

## 2013-05-05 MED ORDER — ONDANSETRON HCL 4 MG/2ML IJ SOLN
4.0000 mg | Freq: Once | INTRAMUSCULAR | Status: AC | PRN
  Administered 2013-05-05: 4 mg via INTRAVENOUS

## 2013-05-05 MED ORDER — OXYCODONE-ACETAMINOPHEN 5-325 MG PO TABS: 1.0000 | ORAL_TABLET | ORAL | Status: DC | PRN

## 2013-05-05 MED ORDER — SODIUM CHLORIDE 0.9 % IR SOLN
Status: DC | PRN
  Administered 2013-05-05: 12000 mL

## 2013-05-05 MED ORDER — ACETAMINOPHEN 325 MG PO TABS: 325.0000 mg | ORAL_TABLET | ORAL | Status: DC | PRN

## 2013-05-05 MED ORDER — DIAZEPAM 5 MG PO TABS: 2.5000 mg | ORAL_TABLET | Freq: Four times a day (QID) | ORAL | Status: DC | PRN

## 2013-05-05 SURGICAL SUPPLY — 63 items
ANCH SUT 2 CRKSRW FT 14X4.5 (Anchor) ×1 IMPLANT
ANCH SUT SWLK 19.1X4.75 VT (Anchor) ×2 IMPLANT
ANCHOR PEEK 4.75X19.1 SWLK C (Anchor) ×2 IMPLANT
ANCHOR PEEK CORKSCREW 4.5 (Anchor) ×1 IMPLANT
BLADE CUTTER GATOR 3.5 (BLADE) ×2 IMPLANT
BLADE GREAT WHITE 4.2 (BLADE) ×2 IMPLANT
BLADE SURG 11 STRL SS (BLADE) ×2 IMPLANT
BOOTCOVER CLEANROOM LRG (PROTECTIVE WEAR) ×4 IMPLANT
BUR 3.5 LG SPHERICAL (BURR) IMPLANT
BUR OVAL 4.0 (BURR) ×2 IMPLANT
BURR 3.5 LG SPHERICAL (BURR)
CANISTER SUCT LVC 12 LTR MEDI- (MISCELLANEOUS) ×2 IMPLANT
CANNULA ACUFLEX KIT 5X76 (CANNULA) ×2 IMPLANT
CANNULA DRILOCK 5.0X75 (CANNULA) ×3 IMPLANT
CLSR STERI-STRIP ANTIMIC 1/2X4 (GAUZE/BANDAGES/DRESSINGS) ×1 IMPLANT
CONNECTOR 5 IN 1 STRAIGHT STRL (MISCELLANEOUS) ×2 IMPLANT
DRAPE INCISE 23X17 IOBAN STRL (DRAPES) ×1
DRAPE INCISE 23X17 STRL (DRAPES) ×1 IMPLANT
DRAPE INCISE IOBAN 23X17 STRL (DRAPES) ×1 IMPLANT
DRAPE INCISE IOBAN 66X45 STRL (DRAPES) ×2 IMPLANT
DRAPE STERI 35X30 U-POUCH (DRAPES) ×2 IMPLANT
DRAPE SURG 17X11 SM STRL (DRAPES) ×2 IMPLANT
DRAPE U-SHAPE 47X51 STRL (DRAPES) ×2 IMPLANT
DRSG PAD ABDOMINAL 8X10 ST (GAUZE/BANDAGES/DRESSINGS) ×4 IMPLANT
DURAPREP 26ML APPLICATOR (WOUND CARE) ×4 IMPLANT
ELECT REM PT RETURN 9FT ADLT (ELECTROSURGICAL) ×2
ELECTRODE REM PT RTRN 9FT ADLT (ELECTROSURGICAL) ×1 IMPLANT
GLOVE BIO SURGEON STRL SZ7.5 (GLOVE) ×2 IMPLANT
GLOVE BIO SURGEON STRL SZ8 (GLOVE) ×2 IMPLANT
GLOVE EUDERMIC 7 POWDERFREE (GLOVE) ×2 IMPLANT
GLOVE SS BIOGEL STRL SZ 7.5 (GLOVE) ×1 IMPLANT
GLOVE SUPERSENSE BIOGEL SZ 7.5 (GLOVE) ×1
GOWN STRL NON-REIN LRG LVL3 (GOWN DISPOSABLE) ×2 IMPLANT
GOWN STRL REIN XL XLG (GOWN DISPOSABLE) ×8 IMPLANT
KIT BASIN OR (CUSTOM PROCEDURE TRAY) ×2 IMPLANT
KIT ROOM TURNOVER OR (KITS) ×2 IMPLANT
KIT SHOULDER TRACTION (DRAPES) ×2 IMPLANT
MANIFOLD NEPTUNE II (INSTRUMENTS) ×2 IMPLANT
NDL SPNL 18GX3.5 QUINCKE PK (NEEDLE) ×1 IMPLANT
NDL SUT 6 .5 CRC .975X.05 MAYO (NEEDLE) IMPLANT
NEEDLE MAYO TAPER (NEEDLE)
NEEDLE SPNL 18GX3.5 QUINCKE PK (NEEDLE) ×2 IMPLANT
NS IRRIG 1000ML POUR BTL (IV SOLUTION) ×2 IMPLANT
PACK SHOULDER (CUSTOM PROCEDURE TRAY) ×2 IMPLANT
PAD ARMBOARD 7.5X6 YLW CONV (MISCELLANEOUS) ×4 IMPLANT
SET ARTHROSCOPY TUBING (MISCELLANEOUS) ×2
SET ARTHROSCOPY TUBING LN (MISCELLANEOUS) ×1 IMPLANT
SLING ARM LRG ADULT FOAM STRAP (SOFTGOODS) IMPLANT
SLING ARM MED ADULT FOAM STRAP (SOFTGOODS) ×2 IMPLANT
SLING SWATHE LARGE (SOFTGOODS) ×1 IMPLANT
SPONGE GAUZE 4X4 12PLY (GAUZE/BANDAGES/DRESSINGS) ×2 IMPLANT
SPONGE LAP 4X18 X RAY DECT (DISPOSABLE) ×2 IMPLANT
STRIP CLOSURE SKIN 1/2X4 (GAUZE/BANDAGES/DRESSINGS) ×2 IMPLANT
SUT MNCRL AB 3-0 PS2 18 (SUTURE) ×2 IMPLANT
SUT PDS AB 0 CT 36 (SUTURE) IMPLANT
SUT RETRIEVER GRASP 30 DEG (SUTURE) ×1 IMPLANT
SYR 20CC LL (SYRINGE) ×2 IMPLANT
TAPE CLOTH SURG 6X10 WHT LF (GAUZE/BANDAGES/DRESSINGS) ×1 IMPLANT
TAPE PAPER 3X10 WHT MICROPORE (GAUZE/BANDAGES/DRESSINGS) ×2 IMPLANT
TOWEL OR 17X24 6PK STRL BLUE (TOWEL DISPOSABLE) ×2 IMPLANT
TOWEL OR 17X26 10 PK STRL BLUE (TOWEL DISPOSABLE) ×2 IMPLANT
WAND SUCTION MAX 4MM 90S (SURGICAL WAND) ×2 IMPLANT
WATER STERILE IRR 1000ML POUR (IV SOLUTION) ×2 IMPLANT

## 2013-05-05 NOTE — Anesthesia Procedure Notes (Addendum)
Anesthesia Regional Block:  Interscalene brachial plexus block  Pre-Anesthetic Checklist: ,, timeout performed, Correct Patient, Correct Site, Correct Laterality, Correct Procedure, Correct Position, site marked, Risks and benefits discussed,  Surgical consent,  Pre-op evaluation,  At surgeon's request and post-op pain management  Laterality: Upper and Left  Prep: chloraprep       Needles:  Injection technique: Single-shot  Needle Type: Echogenic Stimulator Needle          Additional Needles:  Procedures: ultrasound guided (picture in chart) and nerve stimulator Interscalene brachial plexus block  Nerve Stimulator or Paresthesia:  Response: deltoid, 0.5 mA,   Additional Responses:   Narrative:  Start time: 05/05/2013 11:47 AM End time: 05/05/2013 11:57 AM Injection made incrementally with aspirations every 5 mL.  Performed by: Personally  Anesthesiologist: Ermalene Postin  Additional Notes: H+P and labs reviewed, risks and benefits discussed with patient, procedure tolerated well without complications   Procedure Name: Intubation Date/Time: 05/05/2013 12:17 PM Performed by: Rush Farmer E Pre-anesthesia Checklist: Patient identified, Emergency Drugs available, Suction available, Patient being monitored and Timeout performed Patient Re-evaluated:Patient Re-evaluated prior to inductionOxygen Delivery Method: Circle system utilized Preoxygenation: Pre-oxygenation with 100% oxygen Intubation Type: IV induction Ventilation: Mask ventilation without difficulty and Oral airway inserted - appropriate to patient size Laryngoscope Size: Mac and 4 Grade View: Grade II Tube type: Oral Tube size: 7.5 mm Number of attempts: 1 Airway Equipment and Method: Stylet Placement Confirmation: ETT inserted through vocal cords under direct vision,  positive ETCO2 and breath sounds checked- equal and bilateral Secured at: 23 cm Tube secured with: Tape Dental Injury: Teeth and Oropharynx as per  pre-operative assessment

## 2013-05-05 NOTE — Op Note (Signed)
05/05/2013  2:00 PM  PATIENT:   Austin Robertson  58 y.o. male  PRE-OPERATIVE DIAGNOSIS:  left shoulder impingement, AC joint oa, rotator cuff tear  POST-OPERATIVE DIAGNOSIS:  Same with labral tear and bicep tear  PROCEDURE:  LSA, bicep tenotomy, labral debridement, SAD, DCR, RCR  SURGEON:  Trenyce Loera, Metta Clines M.D.  ASSISTANTS: Shuford pac   ANESTHESIA:   GET + ISB  EBL: min   SPECIMEN:  none  Drains: none   PATIENT DISPOSITION:  PACU - hemodynamically stable.    PLAN OF CARE: Discharge to home after PACU  Dictation# [id number]

## 2013-05-05 NOTE — Discharge Instructions (Signed)
° °Kevin M. Supple, M.D., F.A.A.O.S. °Orthopaedic Surgery °Specializing in Arthroscopic and Reconstructive °Surgery of the Shoulder and Knee °[phone] °[address] - [place],  [address] - Fax [phone] ° °POST-OP SHOULDER ARTHROSCOPIC ROTATOR CUFF  REPAIR INSTRUCTIONS ° °1. Call the office at [phone] to schedule your first post-op appointment 7-10 days from the date of your surgery. ° °2. Leave the steri-strips in place over your incisions when performing dressing changes and showering. You may remove your dressings and begin showering 72 hours from surgery. You can expect drainage that is clear to bloody in nature that occasionally will soak through your dressings. If this occurs go ahead and perform a dressing change. The drainage should lessen daily and when there is no drainage from your incisions feel free to go without a dressing. ° °3. Wear your sling/immobilizer at all times except to perform the exercises below or to occasionally let your arm dangle by your side to stretch your elbow. You also need to sleep in your sling immobilizer until instructed otherwise. ° °4. Range of motion to your elbow, wrist, and hand are encouraged 3-5 times daily. Exercise to your hand and fingers helps to reduce swelling you may experience. ° °5. Utilize ice to the shoulder 3-4 times minimum a day and additionally if you are experiencing pain. ° °6. You may one-armed drive when safely off of narcotics and muscle relaxants. You may use your hand that is in the sling to support the steering wheel only. However, should it be your right arm that is in the sling it is not to be used for gear shifting in a manual transmission. ° °7. If you had a block pre-operatively to provide post-op pain relief you may want to go ahead and begin utilizing your pain meds as your arm begins to wake up. Blocks can sometimes last up to 16-18 hours. If you are still pain-free prior to going to bed you may want to  strongly consider taking a pain medication to avoid being awakened in the night with the onset of pain. A muscle relaxant is also provided for you should you experience muscle spasms. It is recommended that if you are experiencing pain that your pain medication alone is not controlling, add the muscle relaxant along with the pain medication which can give additional pain relief. The first one to two days is generally the most severe of your pain and then should gradually decrease. As your pain lessens it is recommended that you decrease your use of the pain medications to an "as needed basis" only and to always comply with the recommended dosages of the pain medications. ° °8. Pain medications can produce constipation along with their use. If you experience this, the use of an over the counter stool softener or laxative daily is recommended.  ° °9. For additional questions or concerns, please do not hesitate to call the office. If after hours there is an answering service to forward your concerns to the physician on call. ° °POST-OP EXERCISES ° °Pendulum Exercises ° °Perform pendulum exercises while standing and bending at the waist. Support your uninvolved arm on a table or chair and allow your operated arm to hang freely. Make sure to do these exercises passively - not using you shoulder muscle. ° °Repeat 20 times. Do 3 sessions per day. ° °What to eat: ° °For your first meals, you should eat lightly; only small meals initially.  If you do not have nausea, you may eat larger meals.    Avoid spicy, greasy and heavy food.   ° °General Anesthesia, Adult, Care After  °Refer to this sheet in the next few weeks. These instructions provide you with information on caring for yourself after your procedure. Your health care provider may also give you more specific instructions. Your treatment has been planned according to current medical practices, but problems sometimes occur. Call your health care provider if you have any  problems or questions after your procedure.  °WHAT TO EXPECT AFTER THE PROCEDURE  °After the procedure, it is typical to experience:  °Sleepiness.  °Nausea and vomiting. °HOME CARE INSTRUCTIONS  °For the first 24 hours after general anesthesia:  °Have a responsible person with you.  °Do not drive a car. If you are alone, do not take public transportation.  °Do not drink alcohol.  °Do not take medicine that has not been prescribed by your health care provider.  °Do not sign important papers or make important decisions.  °You may resume a normal diet and activities as directed by your health care provider.  °Change bandages (dressings) as directed.  °If you have questions or problems that seem related to general anesthesia, call the hospital and ask for the anesthetist or anesthesiologist on call. °SEEK MEDICAL CARE IF:  °You have nausea and vomiting that continue the day after anesthesia.  °You develop a rash. °SEEK IMMEDIATE MEDICAL CARE IF:  °You have difficulty breathing.  °You have chest pain.  °You have any allergic problems. °Document Released: 05/26/2000 Document Revised: 10/20/2012 Document Reviewed: 09/02/2012  °Exit[employer]® Patient Information ©2014 [employer].  ° ° °

## 2013-05-05 NOTE — Anesthesia Preprocedure Evaluation (Addendum)
Anesthesia Evaluation  Patient identified by MRN, date of birth, ID band Patient awake    Reviewed: Allergy & Precautions, H&P , NPO status , Patient's Chart, lab work & pertinent test results, reviewed documented beta blocker date and time   History of Anesthesia Complications Negative for: history of anesthetic complications  Airway Mallampati: II TM Distance: >3 FB Neck ROM: Full    Dental  (+) Teeth Intact   Pulmonary sleep apnea and Continuous Positive Airway Pressure Ventilation , former smoker,  breath sounds clear to auscultation        Cardiovascular hypertension, Pt. on home beta blockers - angina- Past MI and - CHF Rhythm:Regular Rate:Normal     Neuro/Psych negative neurological ROS  negative psych ROS   GI/Hepatic Neg liver ROS, PUD,   Endo/Other  diabetes, Poorly Controlled  Renal/GU negative Renal ROS     Musculoskeletal   Abdominal   Peds  Hematology negative hematology ROS (+)   Anesthesia Other Findings   Reproductive/Obstetrics                        Anesthesia Physical Anesthesia Plan  ASA: III  Anesthesia Plan: General and Regional   Post-op Pain Management:    Induction: Intravenous  Airway Management Planned: Oral ETT  Additional Equipment: None  Intra-op Plan:   Post-operative Plan: Extubation in OR  Informed Consent: I have reviewed the patients History and Physical, chart, labs and discussed the procedure including the risks, benefits and alternatives for the proposed anesthesia with the patient or authorized representative who has indicated his/her understanding and acceptance.   Dental advisory given  Plan Discussed with: Surgeon and CRNA  Anesthesia Plan Comments:        Anesthesia Quick Evaluation

## 2013-05-05 NOTE — Preoperative (Signed)
Beta Blockers   Reason not to administer Beta Blockers:Not Applicable 

## 2013-05-05 NOTE — Transfer of Care (Signed)
Immediate Anesthesia Transfer of Care Note  Patient: Austin Robertson  Procedure(s) Performed: Procedure(s): LEFT SHOULDER ARTHROSCOPY WITH ROTATOR CUFF REPAIR AND SUBACROMIAL DECOMPRESSION, DISTAL CLAVICLE RESECTION (Left)  Patient Location: PACU  Anesthesia Type:General  Level of Consciousness: awake, alert  and oriented  Airway & Oxygen Therapy: Patient Spontanous Breathing and Patient connected to nasal cannula oxygen  Post-op Assessment: Report given to PACU RN, Post -op Vital signs reviewed and stable and Patient moving all extremities X 4  Post vital signs: Reviewed and stable  Complications: No apparent anesthesia complications

## 2013-05-05 NOTE — Anesthesia Postprocedure Evaluation (Signed)
  Anesthesia Post-op Note  Patient: Austin Robertson  Procedure(s) Performed: Procedure(s): LEFT SHOULDER ARTHROSCOPY WITH ROTATOR CUFF REPAIR AND SUBACROMIAL DECOMPRESSION, DISTAL CLAVICLE RESECTION (Left)  Patient Location: PACU  Anesthesia Type:General and Regional  Level of Consciousness: awake, alert  and oriented  Airway and Oxygen Therapy: Patient Spontanous Breathing  Post-op Pain: mild  Post-op Assessment: Post-op Vital signs reviewed, Patient's Cardiovascular Status Stable, Respiratory Function Stable, Patent Airway, No signs of Nausea or vomiting and Pain level controlled  Post-op Vital Signs: Reviewed and stable  Complications: No apparent anesthesia complications

## 2013-05-05 NOTE — H&P (Signed)
Austin Robertson    Chief Complaint: left shoulder impingement HPI: The patient is a 58 y.o. male with chronic left shoulder impingement, AC OA,  and rotator cuff tear  Past Medical History  Diagnosis Date  . Arthritis     shoulder ,knees and hands  . Dry eyes     uses Clear Eyes daily as needed  . Diabetes mellitus without complication     Novolin 70/30  . Hyperlipidemia     takes Niacin daily  . Hypertension     takes Metoprolol daily  . Gallstones   . Sleep apnea     sleep study done at least 30yrs ago and uses CPAP  . History of migraine     unknown to when last one was  . Vertigo     but no meds required  . Joint pain   . History of kidney stones   . Multiple gastric ulcers     many,many yrs ago  . History of colon polyps   . Urinary frequency   . Urinary urgency   . Enlarged prostate     takes Urinozinc bid  . Cataracts, bilateral     immature  . Complication of anesthesia     "woke up" and was resistant during ureteroscopy for kidney stone retrieval many years ago    Past Surgical History  Procedure Laterality Date  . Shoulder arthroscopy Right   . Lithotripsy      x 3  . Knee arthroscopy Right   . Colonoscopy    . Cystoscopy    . Carpal tunnel release Bilateral   . Trigger finger release      x 3  . Esophagogastroduodenoscopy    . Barium enema  79yrs ago    No family history on file.  Social History:  reports that he has quit smoking. His smokeless tobacco use includes Chew. He reports that he drinks alcohol. He reports that he does not use illicit drugs.  Allergies:  Allergies  Allergen Reactions  . Ciprofloxacin Itching    Hives  . Penicillins Swelling and Rash    Medications Prior to Admission  Medication Sig Dispense Refill  . aspirin EC 81 MG tablet Take 1 tablet (81 mg total) by mouth daily.      . Hyprom-Naphaz-Polysorb-Zn Sulf (CLEAR EYES COMPLETE OP) Apply 1 drop to eye 4 (four) times daily as needed (for dry eyes).      . insulin  NPH-regular Human (NOVOLIN 70/30) (70-30) 100 UNIT/ML injection Inject 100 Units into the skin 2 (two) times daily with a meal.       . meloxicam (MOBIC) 15 MG tablet Take 1 tablet (15 mg total) by mouth daily.      . metoprolol tartrate (LOPRESSOR) 25 MG tablet Take 25 mg by mouth 2 (two) times daily.      . Misc Natural Products (URINOZINC PLUS) TABS Take 2 capsules by mouth daily.      . Multiple Vitamin (MULTIVITAMIN WITH MINERALS) TABS tablet Take 1 tablet by mouth daily.      . niacin 500 MG CR capsule Take 500 mg by mouth daily.      . Omega 3 1200 MG CAPS Take 1 capsule by mouth 2 (two) times daily.       Marland Kitchen oxyCODONE-acetaminophen (ROXICET) 5-325 MG per tablet Take 1-2 tablets by mouth every 4 (four) hours as needed for severe pain.  30 tablet  0     Physical Exam: left shoulder with  painful and restricted motion as noted at recent office visits  Vitals     Assessment/Plan  Impression: left shoulder impingement  Plan of Action: Procedure(s): LEFT SHOULDER ARTHROSCOPY WITH ROTATOR CUFF REPAIR AND SUBACROMIAL DECOMPRESSION, DISTAL CLAVICLE RESECTION  Austin Robertson M 05/05/2013, 11:32 AM

## 2013-05-06 ENCOUNTER — Encounter (HOSPITAL_COMMUNITY): Payer: Self-pay | Admitting: Orthopedic Surgery

## 2013-05-06 NOTE — Op Note (Signed)
NAME:  LINFORD, QUINTELA NO.:  [id number]  MEDICAL RECORD NO.:  [id number]  LOCATION:  [facility]                         FACILITY:  [facility]  PHYSICIAN:  Metta Clines. Dariyah Garduno, M.D.  DATE OF BIRTH:  1955/10/18  DATE OF PROCEDURE:  05/05/2013 DATE OF DISCHARGE:  05/05/2013                              OPERATIVE REPORT   PREOPERATIVE DIAGNOSIS: 1. Chronic left shoulder impingement syndrome. 2. Left shoulder rotator cuff tear. 3. Left shoulder acromioclavicular joint arthropathy.  POSTOPERATIVE DIAGNOSIS: 1. Chronic left shoulder impingement syndrome. 2. Left shoulder rotator cuff tear. 3. Left shoulder acromioclavicular joint arthropathy. 4. Left shoulder biceps tendon near complete tear. 5. Left shoulder labral tear.  PROCEDURES: 1. Left shoulder examination under anesthesia. 2. Left shoulder diagnostic arthroscopy. 3. Biceps tenotomy. 4. Labral debridement. 5. Arthroscopic subacromial decompression bursectomy. 6. Arthroscopic distal clavicle resection. 7. Arthroscopic rotator cuff repair using a double row suture repair     construct.  SURGEON:  Metta Clines. Baelyn Doring, M.D.  Terrence DupontOlivia Mackie A. Shuford, P.A.-C.  ANESTHESIA:  General endotracheal as well as an interscalene block.  ESTIMATED BLOOD LOSS:  Minimal.  DRAINS:  None.  HISTORY:  Mr. Mcenery is a 58 year old gentleman who has chronic progressively increasing left shoulder pain with impingement syndrome. An MRI scan showing evidence for full-thickness tear of rotator cuff. Due to his ongoing pain and function limitations, he is brought to the operating room at this time for planned left shoulder arthroscopy as described below.  Preoperatively, I counseled Mr. Mcfetridge on treatment options as well as risks versus benefits thereof.  Possible surgical complications were reviewed with potential for bleeding, infection, neurovascular injury, persistent pain, loss of motion, anesthetic complication,  recurrence rotator cuff tear and possible need for additional surgery.  He understands and accepts and agrees with our planned procedure.  PROCEDURE IN DETAIL:  After undergoing routine preop evaluation, the patient received prophylactic antibiotics.  An interscalene block was established in the holding area by the Anesthesia Department.  Placed supine on the operating room table, and underwent smooth induction of general endotracheal anesthesia.  Turned to the right lateral decubitus position on a beanbag and appropriately padded and protected.  Left shoulder examination under anesthesia revealed forward elevation approximately 160 degrees, although there were no obvious adhesions.  He did have some mild restrictions __________ of about 80 degrees of external and internal rotation.  Certainly, no instability patterns noted.  Left arm was then suspended at 70 degrees of abduction with 15 pounds of traction, and left shoulder girdle region was sterilely prepped and draped in standard fashion.  Time-out was called.  Posterior portal was established at the glenohumeral joint and anterior portal established under direct visualization.  The articular surfaces were found to be in good condition.  There was, however, marked degenerative tearing of the superior labrum and also the proximal biceps with multiple frayed strands of the biceps demonstrating a near complete tear.  I went ahead and completed the biceps tenotomy and then used a shaver to debride the superior labrum back to stable margin.  There was also an obvious full-thickness defect of the distal supraspinatus.  No instability patterns were noted.  Articular surfaces  were in good condition.  At this point, fluid and instruments were removed from the glenohumeral joint.  The arm was dropped down to 30 degrees of abduction with the arthroscope introduced into the subacromial space at the posterior portal and a direct lateral portal at  subacromial space. Abundant dense bursal tissue and multiple adhesions were encounter. These were all divided size commissure __________ Stryker wand.  The wand was then used to remove the periosteum from the undersurface of the anterior half of the acromion, and the subacromial decompression was performed with a bur creating a type 1 morphology.  Portal was established directly into the distal clavicle and distal clavicle resection was performed with a bur.  Care was taken to confirm visualization of the entire circumference of the distal clavicle to ensure adequate removal of bone.  We then completed the subacromial/subdeltoid bursectomy.  __________ was readily identified and debrided back to healthy tissue.  The greater tuberosity was then prepared gently abrading the bone with approximately 2.5 cm in width. An accessory portal device was established and through stab wound lateral margin of the acromion, placed an Arthrex PEEK corkscrew suture anchor.  The limbs of the suture anchor were then shuttled equal distance across the width of the rotator cuff tear in a horizontal mattress pattern and then tied with sliding locking knots followed by multiple __________ lock suture anchors which nicely compressed the free margin of the rotator cuff against __________ tuberosity.  The overall construct was much to our satisfaction.  Suture limbs were all clipped. Bursectomy is completed.  Good hemostasis was obtained.  Fluid was removed.  The port was closed with Monocryl and Steri-Strips.  A dry dressing taped at the right shoulder right implant sling the patient was awakened, extubated, to recovery room in stable condition.  Jenetta Loges, PA-C was used and assisted throughout this case essential for help with positioning extremity,  positioning the patient, management of arthroscopic tissue and the manipulation, suture management, wound closure, and intraoperative decision  making.     Metta Clines. Nabilah Davoli, M.D.     KMS/MEDQ  D:  05/05/2013  T:  05/06/2013  Job:  [id number]

## 2015-01-17 IMAGING — CR DG ABDOMEN 1V
1 series · 1 of 1 positions shown · non-contrast
Comparison: CT UROGRAM dated 03/19/2013

CLINICAL DATA: Preop

EXAM:
ABDOMEN - 1 VIEW

[t abdomen supine]
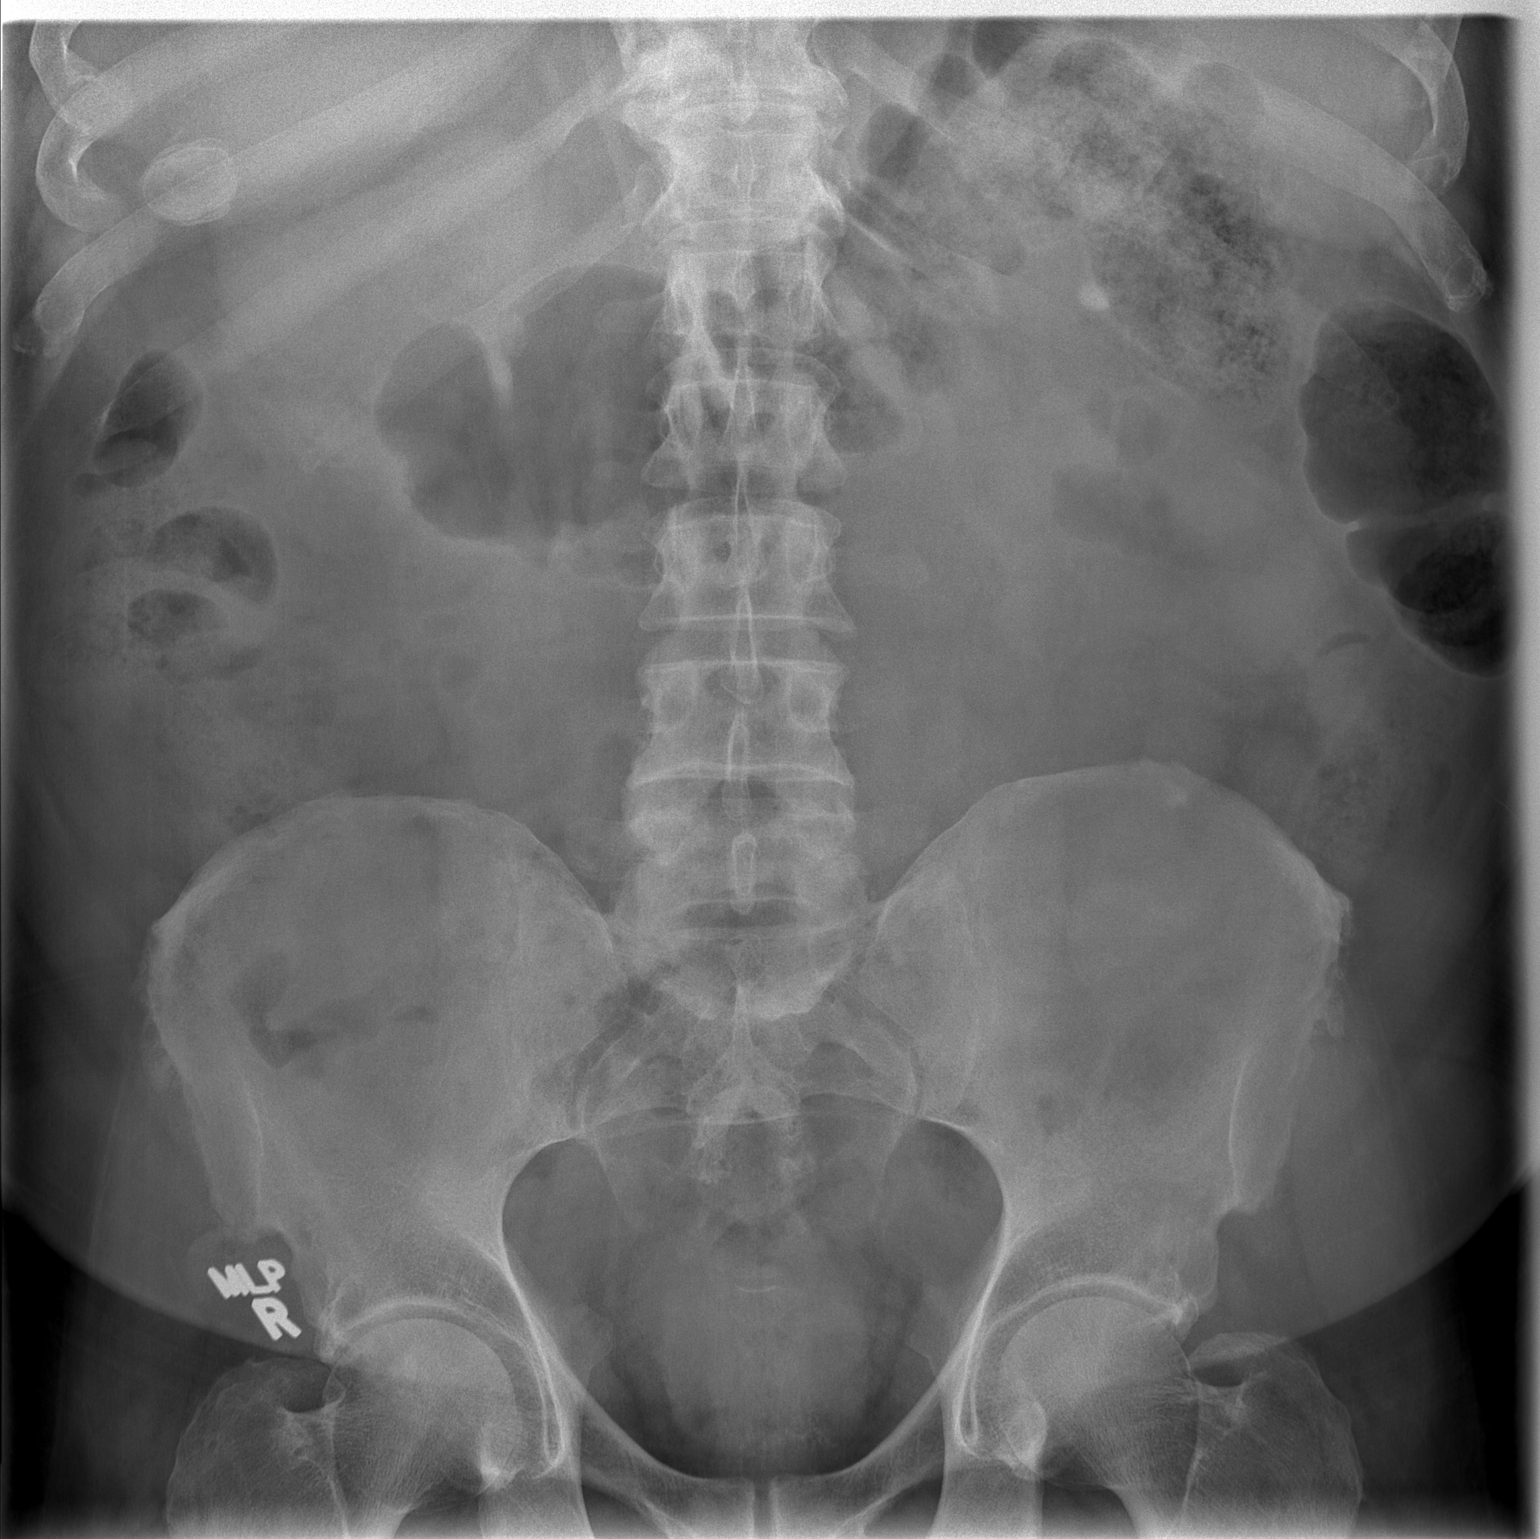

[1 of 1 positions shown; findings below may reference images not displayed]

FINDINGS: Stable left pelvic calculus. No disproportionate dilatation of
bowel. Large right gallstone.
IMPRESSION: Left nephrolithiasis.

## 2015-02-20 IMAGING — CR DG CHEST 2V
2 series · 2 of 2 positions shown · non-contrast
Comparison: None.

CLINICAL DATA: Preop evaluation.

EXAM:
CHEST  2 VIEW

[w chest pa]
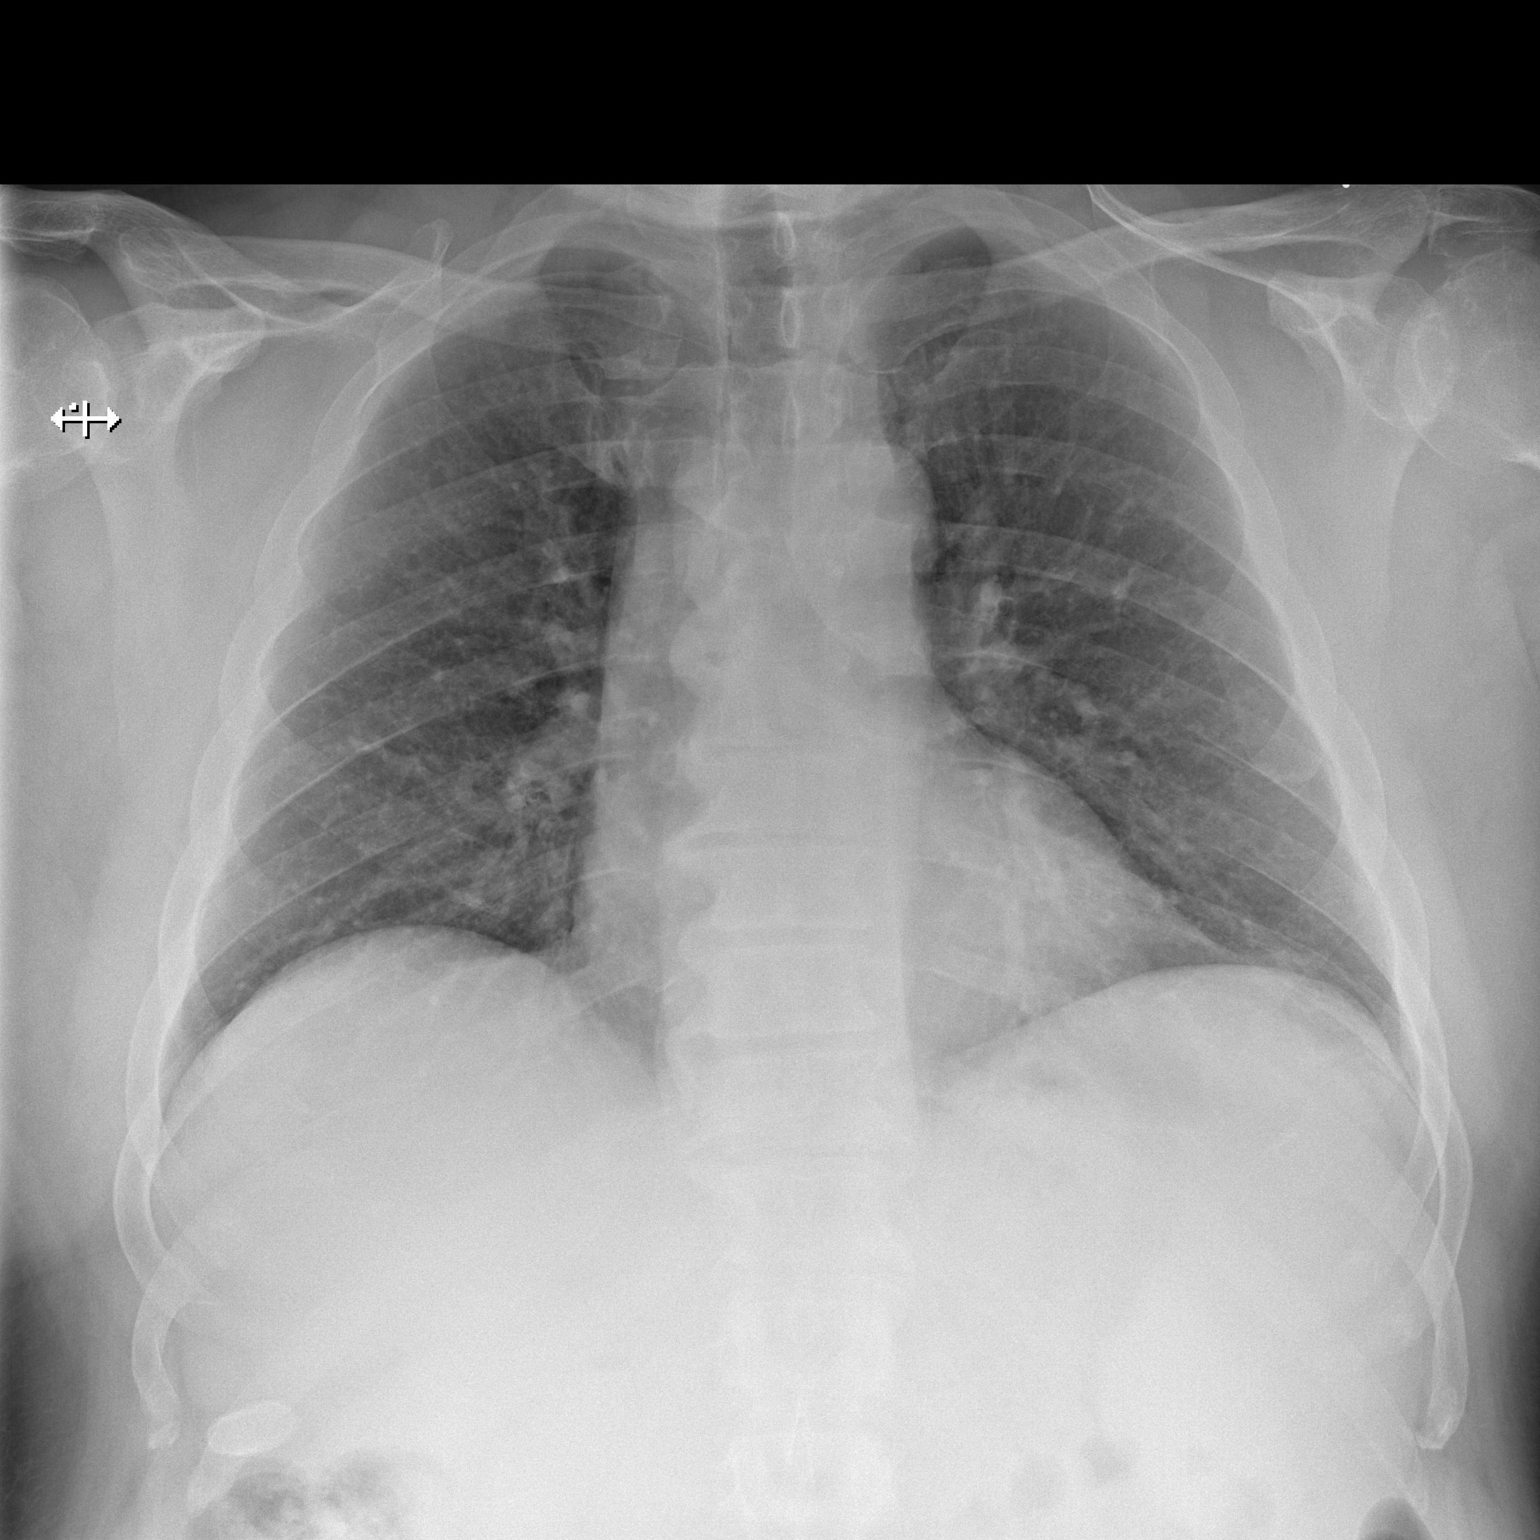

[w chest lat]
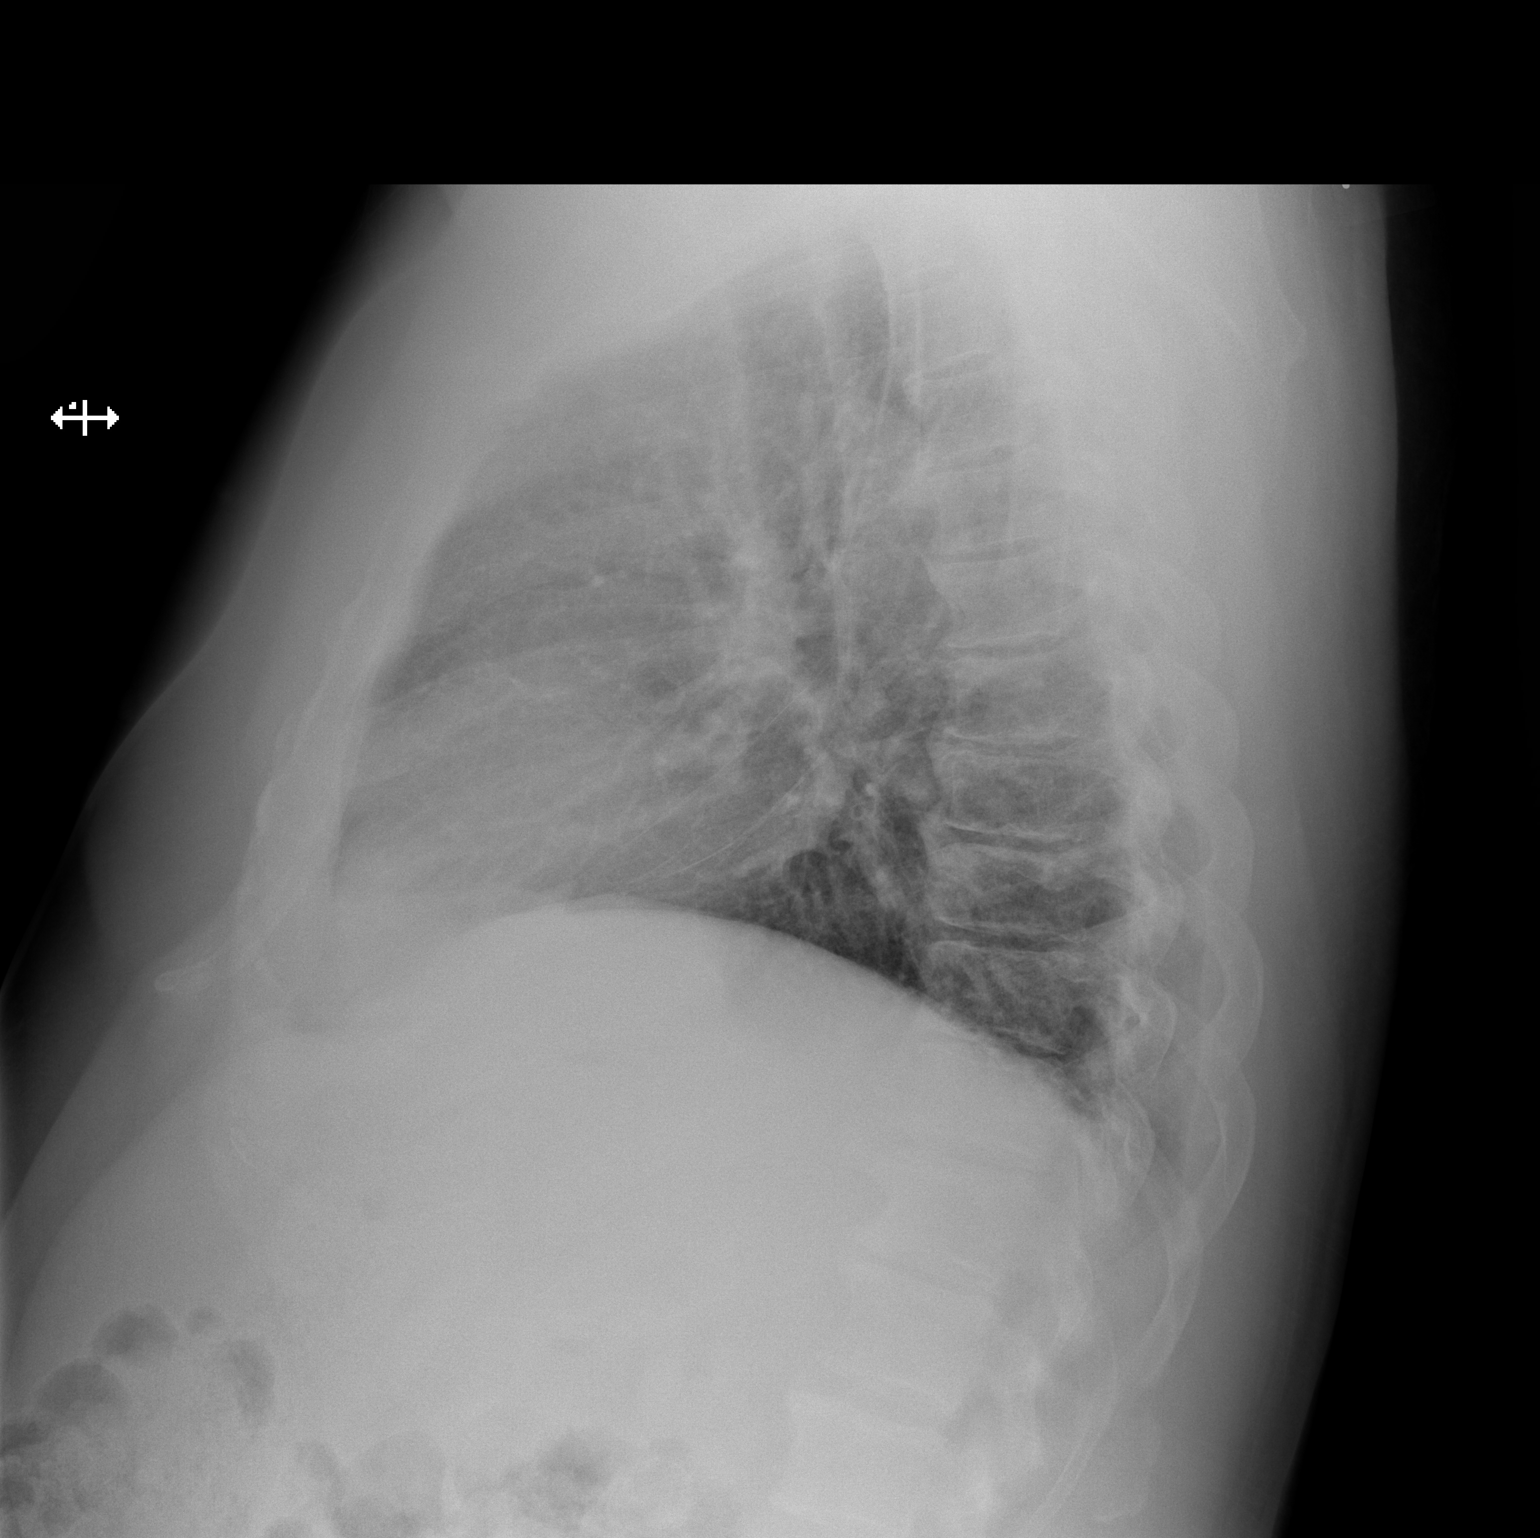

[2 of 2 positions shown; findings below may reference images not displayed]

FINDINGS: Low lung volumes. The heart size and mediastinal contours are within
normal limits. Both lungs are clear. The visualized skeletal
structures are unremarkable.
IMPRESSION: No active cardiopulmonary disease.

## 2015-08-23 DIAGNOSIS — E113293 Type 2 diabetes mellitus with mild nonproliferative diabetic retinopathy without macular edema, bilateral: Secondary | ICD-10-CM | POA: Diagnosis not present

## 2015-09-12 DIAGNOSIS — Z1389 Encounter for screening for other disorder: Secondary | ICD-10-CM | POA: Diagnosis not present

## 2015-09-12 DIAGNOSIS — D693 Immune thrombocytopenic purpura: Secondary | ICD-10-CM | POA: Diagnosis not present

## 2015-09-12 DIAGNOSIS — I10 Essential (primary) hypertension: Secondary | ICD-10-CM | POA: Diagnosis not present

## 2015-09-12 DIAGNOSIS — E119 Type 2 diabetes mellitus without complications: Secondary | ICD-10-CM | POA: Diagnosis not present

## 2015-09-12 DIAGNOSIS — G4762 Sleep related leg cramps: Secondary | ICD-10-CM | POA: Diagnosis not present

## 2015-09-12 DIAGNOSIS — E785 Hyperlipidemia, unspecified: Secondary | ICD-10-CM | POA: Diagnosis not present

## 2015-11-22 DIAGNOSIS — I10 Essential (primary) hypertension: Secondary | ICD-10-CM | POA: Diagnosis not present

## 2015-11-22 DIAGNOSIS — H02839 Dermatochalasis of unspecified eye, unspecified eyelid: Secondary | ICD-10-CM | POA: Diagnosis not present

## 2015-11-22 DIAGNOSIS — H18413 Arcus senilis, bilateral: Secondary | ICD-10-CM | POA: Diagnosis not present

## 2015-11-22 DIAGNOSIS — E113293 Type 2 diabetes mellitus with mild nonproliferative diabetic retinopathy without macular edema, bilateral: Secondary | ICD-10-CM | POA: Diagnosis not present

## 2015-11-22 DIAGNOSIS — H2511 Age-related nuclear cataract, right eye: Secondary | ICD-10-CM | POA: Diagnosis not present

## 2016-01-09 DIAGNOSIS — Z794 Long term (current) use of insulin: Secondary | ICD-10-CM | POA: Diagnosis not present

## 2016-01-09 DIAGNOSIS — Z6831 Body mass index (BMI) 31.0-31.9, adult: Secondary | ICD-10-CM | POA: Diagnosis not present

## 2016-01-09 DIAGNOSIS — E1136 Type 2 diabetes mellitus with diabetic cataract: Secondary | ICD-10-CM | POA: Diagnosis not present

## 2016-01-09 DIAGNOSIS — G4733 Obstructive sleep apnea (adult) (pediatric): Secondary | ICD-10-CM | POA: Diagnosis not present

## 2016-01-09 DIAGNOSIS — H2513 Age-related nuclear cataract, bilateral: Secondary | ICD-10-CM | POA: Diagnosis not present

## 2016-01-09 DIAGNOSIS — Z880 Allergy status to penicillin: Secondary | ICD-10-CM | POA: Diagnosis not present

## 2016-01-09 DIAGNOSIS — Z87891 Personal history of nicotine dependence: Secondary | ICD-10-CM | POA: Diagnosis not present

## 2016-01-09 DIAGNOSIS — E039 Hypothyroidism, unspecified: Secondary | ICD-10-CM | POA: Diagnosis not present

## 2016-01-09 DIAGNOSIS — I10 Essential (primary) hypertension: Secondary | ICD-10-CM | POA: Diagnosis not present

## 2016-01-09 DIAGNOSIS — H2511 Age-related nuclear cataract, right eye: Secondary | ICD-10-CM | POA: Diagnosis not present

## 2016-01-09 DIAGNOSIS — Z7982 Long term (current) use of aspirin: Secondary | ICD-10-CM | POA: Diagnosis not present

## 2016-01-09 DIAGNOSIS — Z79899 Other long term (current) drug therapy: Secondary | ICD-10-CM | POA: Diagnosis not present

## 2016-01-09 DIAGNOSIS — E669 Obesity, unspecified: Secondary | ICD-10-CM | POA: Diagnosis not present

## 2016-01-10 DIAGNOSIS — H2512 Age-related nuclear cataract, left eye: Secondary | ICD-10-CM | POA: Diagnosis not present

## 2016-01-14 DIAGNOSIS — E785 Hyperlipidemia, unspecified: Secondary | ICD-10-CM | POA: Diagnosis not present

## 2016-01-14 DIAGNOSIS — E119 Type 2 diabetes mellitus without complications: Secondary | ICD-10-CM | POA: Diagnosis not present

## 2016-01-14 DIAGNOSIS — D693 Immune thrombocytopenic purpura: Secondary | ICD-10-CM | POA: Diagnosis not present

## 2016-01-14 DIAGNOSIS — I10 Essential (primary) hypertension: Secondary | ICD-10-CM | POA: Diagnosis not present

## 2016-01-23 DIAGNOSIS — H2513 Age-related nuclear cataract, bilateral: Secondary | ICD-10-CM | POA: Diagnosis not present

## 2016-01-23 DIAGNOSIS — N5089 Other specified disorders of the male genital organs: Secondary | ICD-10-CM | POA: Diagnosis not present

## 2016-01-23 DIAGNOSIS — E669 Obesity, unspecified: Secondary | ICD-10-CM | POA: Diagnosis not present

## 2016-01-23 DIAGNOSIS — E1136 Type 2 diabetes mellitus with diabetic cataract: Secondary | ICD-10-CM | POA: Diagnosis not present

## 2016-01-23 DIAGNOSIS — H2512 Age-related nuclear cataract, left eye: Secondary | ICD-10-CM | POA: Diagnosis not present

## 2016-01-23 DIAGNOSIS — Z880 Allergy status to penicillin: Secondary | ICD-10-CM | POA: Diagnosis not present

## 2016-01-23 DIAGNOSIS — Z6832 Body mass index (BMI) 32.0-32.9, adult: Secondary | ICD-10-CM | POA: Diagnosis not present

## 2016-01-23 DIAGNOSIS — G4733 Obstructive sleep apnea (adult) (pediatric): Secondary | ICD-10-CM | POA: Diagnosis not present

## 2016-02-11 DIAGNOSIS — G4733 Obstructive sleep apnea (adult) (pediatric): Secondary | ICD-10-CM | POA: Diagnosis not present

## 2016-05-19 DIAGNOSIS — D693 Immune thrombocytopenic purpura: Secondary | ICD-10-CM | POA: Diagnosis not present

## 2016-05-19 DIAGNOSIS — E039 Hypothyroidism, unspecified: Secondary | ICD-10-CM | POA: Diagnosis not present

## 2016-05-19 DIAGNOSIS — E785 Hyperlipidemia, unspecified: Secondary | ICD-10-CM | POA: Diagnosis not present

## 2016-05-19 DIAGNOSIS — E119 Type 2 diabetes mellitus without complications: Secondary | ICD-10-CM | POA: Diagnosis not present

## 2016-05-19 DIAGNOSIS — I10 Essential (primary) hypertension: Secondary | ICD-10-CM | POA: Diagnosis not present

## 2016-05-29 DIAGNOSIS — R51 Headache: Secondary | ICD-10-CM | POA: Diagnosis not present

## 2016-08-14 DIAGNOSIS — G4733 Obstructive sleep apnea (adult) (pediatric): Secondary | ICD-10-CM | POA: Diagnosis not present

## 2016-08-26 DIAGNOSIS — E113293 Type 2 diabetes mellitus with mild nonproliferative diabetic retinopathy without macular edema, bilateral: Secondary | ICD-10-CM | POA: Diagnosis not present

## 2016-08-29 DIAGNOSIS — E039 Hypothyroidism, unspecified: Secondary | ICD-10-CM | POA: Diagnosis not present

## 2016-08-29 DIAGNOSIS — D693 Immune thrombocytopenic purpura: Secondary | ICD-10-CM | POA: Diagnosis not present

## 2016-08-29 DIAGNOSIS — E785 Hyperlipidemia, unspecified: Secondary | ICD-10-CM | POA: Diagnosis not present

## 2016-08-29 DIAGNOSIS — I10 Essential (primary) hypertension: Secondary | ICD-10-CM | POA: Diagnosis not present

## 2016-08-29 DIAGNOSIS — E119 Type 2 diabetes mellitus without complications: Secondary | ICD-10-CM | POA: Diagnosis not present

## 2016-11-28 DIAGNOSIS — E669 Obesity, unspecified: Secondary | ICD-10-CM | POA: Diagnosis not present

## 2016-11-28 DIAGNOSIS — E1165 Type 2 diabetes mellitus with hyperglycemia: Secondary | ICD-10-CM | POA: Diagnosis not present

## 2016-11-28 DIAGNOSIS — I10 Essential (primary) hypertension: Secondary | ICD-10-CM | POA: Diagnosis not present

## 2016-11-28 DIAGNOSIS — Z1389 Encounter for screening for other disorder: Secondary | ICD-10-CM | POA: Diagnosis not present

## 2016-11-28 DIAGNOSIS — D693 Immune thrombocytopenic purpura: Secondary | ICD-10-CM | POA: Diagnosis not present

## 2016-11-28 DIAGNOSIS — E785 Hyperlipidemia, unspecified: Secondary | ICD-10-CM | POA: Diagnosis not present

## 2016-12-22 DIAGNOSIS — E113293 Type 2 diabetes mellitus with mild nonproliferative diabetic retinopathy without macular edema, bilateral: Secondary | ICD-10-CM | POA: Diagnosis not present

## 2017-01-28 DIAGNOSIS — G4733 Obstructive sleep apnea (adult) (pediatric): Secondary | ICD-10-CM | POA: Diagnosis not present

## 2017-03-11 DIAGNOSIS — Z2821 Immunization not carried out because of patient refusal: Secondary | ICD-10-CM | POA: Diagnosis not present

## 2017-03-11 DIAGNOSIS — E1165 Type 2 diabetes mellitus with hyperglycemia: Secondary | ICD-10-CM | POA: Diagnosis not present

## 2017-03-11 DIAGNOSIS — I10 Essential (primary) hypertension: Secondary | ICD-10-CM | POA: Diagnosis not present

## 2017-03-11 DIAGNOSIS — D693 Immune thrombocytopenic purpura: Secondary | ICD-10-CM | POA: Diagnosis not present

## 2017-06-09 DIAGNOSIS — D693 Immune thrombocytopenic purpura: Secondary | ICD-10-CM | POA: Diagnosis not present

## 2017-06-09 DIAGNOSIS — I10 Essential (primary) hypertension: Secondary | ICD-10-CM | POA: Diagnosis not present

## 2017-06-09 DIAGNOSIS — E785 Hyperlipidemia, unspecified: Secondary | ICD-10-CM | POA: Diagnosis not present

## 2017-06-09 DIAGNOSIS — E1165 Type 2 diabetes mellitus with hyperglycemia: Secondary | ICD-10-CM | POA: Diagnosis not present

## 2017-09-14 DIAGNOSIS — I10 Essential (primary) hypertension: Secondary | ICD-10-CM | POA: Diagnosis not present

## 2017-09-14 DIAGNOSIS — E785 Hyperlipidemia, unspecified: Secondary | ICD-10-CM | POA: Diagnosis not present

## 2017-09-14 DIAGNOSIS — D693 Immune thrombocytopenic purpura: Secondary | ICD-10-CM | POA: Diagnosis not present

## 2017-09-14 DIAGNOSIS — E1165 Type 2 diabetes mellitus with hyperglycemia: Secondary | ICD-10-CM | POA: Diagnosis not present

## 2017-12-23 DIAGNOSIS — E113293 Type 2 diabetes mellitus with mild nonproliferative diabetic retinopathy without macular edema, bilateral: Secondary | ICD-10-CM | POA: Diagnosis not present

## 2018-01-12 DIAGNOSIS — G4733 Obstructive sleep apnea (adult) (pediatric): Secondary | ICD-10-CM | POA: Diagnosis not present

## 2018-01-20 DIAGNOSIS — J019 Acute sinusitis, unspecified: Secondary | ICD-10-CM | POA: Diagnosis not present

## 2018-03-18 DIAGNOSIS — M1990 Unspecified osteoarthritis, unspecified site: Secondary | ICD-10-CM | POA: Diagnosis not present

## 2018-03-18 DIAGNOSIS — I10 Essential (primary) hypertension: Secondary | ICD-10-CM | POA: Diagnosis not present

## 2018-03-18 DIAGNOSIS — E1165 Type 2 diabetes mellitus with hyperglycemia: Secondary | ICD-10-CM | POA: Diagnosis not present

## 2018-03-18 DIAGNOSIS — D693 Immune thrombocytopenic purpura: Secondary | ICD-10-CM | POA: Diagnosis not present

## 2018-03-18 DIAGNOSIS — E785 Hyperlipidemia, unspecified: Secondary | ICD-10-CM | POA: Diagnosis not present

## 2018-03-18 DIAGNOSIS — Z1331 Encounter for screening for depression: Secondary | ICD-10-CM | POA: Diagnosis not present

## 2018-04-21 DIAGNOSIS — J40 Bronchitis, not specified as acute or chronic: Secondary | ICD-10-CM | POA: Diagnosis not present

## 2018-04-21 DIAGNOSIS — J111 Influenza due to unidentified influenza virus with other respiratory manifestations: Secondary | ICD-10-CM | POA: Diagnosis not present

## 2018-05-25 DIAGNOSIS — N401 Enlarged prostate with lower urinary tract symptoms: Secondary | ICD-10-CM | POA: Diagnosis not present

## 2018-05-25 DIAGNOSIS — R300 Dysuria: Secondary | ICD-10-CM | POA: Diagnosis not present

## 2018-05-25 DIAGNOSIS — Z6841 Body Mass Index (BMI) 40.0 and over, adult: Secondary | ICD-10-CM | POA: Diagnosis not present

## 2018-09-20 DIAGNOSIS — M1990 Unspecified osteoarthritis, unspecified site: Secondary | ICD-10-CM | POA: Diagnosis not present

## 2018-09-20 DIAGNOSIS — E1165 Type 2 diabetes mellitus with hyperglycemia: Secondary | ICD-10-CM | POA: Diagnosis not present

## 2018-09-20 DIAGNOSIS — D693 Immune thrombocytopenic purpura: Secondary | ICD-10-CM | POA: Diagnosis not present

## 2018-09-20 DIAGNOSIS — E785 Hyperlipidemia, unspecified: Secondary | ICD-10-CM | POA: Diagnosis not present

## 2018-09-20 DIAGNOSIS — I10 Essential (primary) hypertension: Secondary | ICD-10-CM | POA: Diagnosis not present

## 2018-09-29 DIAGNOSIS — Z1211 Encounter for screening for malignant neoplasm of colon: Secondary | ICD-10-CM | POA: Diagnosis not present

## 2018-09-29 DIAGNOSIS — K635 Polyp of colon: Secondary | ICD-10-CM | POA: Diagnosis not present

## 2020-01-10 DIAGNOSIS — R3911 Hesitancy of micturition: Secondary | ICD-10-CM | POA: Diagnosis not present

## 2020-01-10 DIAGNOSIS — D693 Immune thrombocytopenic purpura: Secondary | ICD-10-CM | POA: Diagnosis not present

## 2020-01-10 DIAGNOSIS — Z23 Encounter for immunization: Secondary | ICD-10-CM | POA: Diagnosis not present

## 2020-01-10 DIAGNOSIS — I10 Essential (primary) hypertension: Secondary | ICD-10-CM | POA: Diagnosis not present

## 2020-01-10 DIAGNOSIS — N401 Enlarged prostate with lower urinary tract symptoms: Secondary | ICD-10-CM | POA: Diagnosis not present

## 2020-01-10 DIAGNOSIS — M1990 Unspecified osteoarthritis, unspecified site: Secondary | ICD-10-CM | POA: Diagnosis not present

## 2020-01-10 DIAGNOSIS — G4733 Obstructive sleep apnea (adult) (pediatric): Secondary | ICD-10-CM | POA: Diagnosis not present

## 2020-01-10 DIAGNOSIS — E1165 Type 2 diabetes mellitus with hyperglycemia: Secondary | ICD-10-CM | POA: Diagnosis not present

## 2020-01-10 DIAGNOSIS — E785 Hyperlipidemia, unspecified: Secondary | ICD-10-CM | POA: Diagnosis not present

## 2020-03-21 DIAGNOSIS — J019 Acute sinusitis, unspecified: Secondary | ICD-10-CM | POA: Diagnosis not present

## 2020-03-21 DIAGNOSIS — B9689 Other specified bacterial agents as the cause of diseases classified elsewhere: Secondary | ICD-10-CM | POA: Diagnosis not present

## 2020-03-21 DIAGNOSIS — G4733 Obstructive sleep apnea (adult) (pediatric): Secondary | ICD-10-CM | POA: Diagnosis not present

## 2020-03-21 DIAGNOSIS — Z6841 Body Mass Index (BMI) 40.0 and over, adult: Secondary | ICD-10-CM | POA: Diagnosis not present

## 2020-04-21 DIAGNOSIS — G4733 Obstructive sleep apnea (adult) (pediatric): Secondary | ICD-10-CM | POA: Diagnosis not present

## 2020-04-24 DIAGNOSIS — Z125 Encounter for screening for malignant neoplasm of prostate: Secondary | ICD-10-CM | POA: Diagnosis not present

## 2020-04-24 DIAGNOSIS — D693 Immune thrombocytopenic purpura: Secondary | ICD-10-CM | POA: Diagnosis not present

## 2020-04-24 DIAGNOSIS — Z0000 Encounter for general adult medical examination without abnormal findings: Secondary | ICD-10-CM | POA: Diagnosis not present

## 2020-04-24 DIAGNOSIS — E039 Hypothyroidism, unspecified: Secondary | ICD-10-CM | POA: Diagnosis not present

## 2020-04-24 DIAGNOSIS — E785 Hyperlipidemia, unspecified: Secondary | ICD-10-CM | POA: Diagnosis not present

## 2020-04-24 DIAGNOSIS — E1165 Type 2 diabetes mellitus with hyperglycemia: Secondary | ICD-10-CM | POA: Diagnosis not present

## 2020-04-24 DIAGNOSIS — Z794 Long term (current) use of insulin: Secondary | ICD-10-CM | POA: Diagnosis not present

## 2020-04-24 DIAGNOSIS — Z23 Encounter for immunization: Secondary | ICD-10-CM | POA: Diagnosis not present

## 2020-04-24 DIAGNOSIS — I10 Essential (primary) hypertension: Secondary | ICD-10-CM | POA: Diagnosis not present

## 2020-04-25 DIAGNOSIS — R3912 Poor urinary stream: Secondary | ICD-10-CM | POA: Diagnosis not present

## 2020-04-25 DIAGNOSIS — N200 Calculus of kidney: Secondary | ICD-10-CM | POA: Diagnosis not present

## 2020-04-25 DIAGNOSIS — N401 Enlarged prostate with lower urinary tract symptoms: Secondary | ICD-10-CM | POA: Diagnosis not present

## 2020-05-16 DIAGNOSIS — H4921 Sixth [abducent] nerve palsy, right eye: Secondary | ICD-10-CM | POA: Diagnosis not present

## 2020-05-16 DIAGNOSIS — Z961 Presence of intraocular lens: Secondary | ICD-10-CM | POA: Diagnosis not present

## 2020-05-16 DIAGNOSIS — H26493 Other secondary cataract, bilateral: Secondary | ICD-10-CM | POA: Diagnosis not present

## 2020-05-19 DIAGNOSIS — G4733 Obstructive sleep apnea (adult) (pediatric): Secondary | ICD-10-CM | POA: Diagnosis not present

## 2020-05-22 DIAGNOSIS — G4733 Obstructive sleep apnea (adult) (pediatric): Secondary | ICD-10-CM | POA: Diagnosis not present

## 2020-06-19 DIAGNOSIS — G4733 Obstructive sleep apnea (adult) (pediatric): Secondary | ICD-10-CM | POA: Diagnosis not present

## 2020-07-04 DIAGNOSIS — H26492 Other secondary cataract, left eye: Secondary | ICD-10-CM | POA: Diagnosis not present

## 2020-07-04 DIAGNOSIS — Z961 Presence of intraocular lens: Secondary | ICD-10-CM | POA: Diagnosis not present

## 2020-07-04 DIAGNOSIS — H4921 Sixth [abducent] nerve palsy, right eye: Secondary | ICD-10-CM | POA: Diagnosis not present

## 2020-07-17 DIAGNOSIS — J209 Acute bronchitis, unspecified: Secondary | ICD-10-CM | POA: Diagnosis not present

## 2020-07-17 DIAGNOSIS — J309 Allergic rhinitis, unspecified: Secondary | ICD-10-CM | POA: Diagnosis not present

## 2020-07-19 DIAGNOSIS — G4733 Obstructive sleep apnea (adult) (pediatric): Secondary | ICD-10-CM | POA: Diagnosis not present

## 2020-07-23 DIAGNOSIS — I10 Essential (primary) hypertension: Secondary | ICD-10-CM | POA: Diagnosis not present

## 2020-07-23 DIAGNOSIS — D693 Immune thrombocytopenic purpura: Secondary | ICD-10-CM | POA: Diagnosis not present

## 2020-07-23 DIAGNOSIS — N401 Enlarged prostate with lower urinary tract symptoms: Secondary | ICD-10-CM | POA: Diagnosis not present

## 2020-07-23 DIAGNOSIS — Z794 Long term (current) use of insulin: Secondary | ICD-10-CM | POA: Diagnosis not present

## 2020-07-23 DIAGNOSIS — G4733 Obstructive sleep apnea (adult) (pediatric): Secondary | ICD-10-CM | POA: Diagnosis not present

## 2020-07-23 DIAGNOSIS — E785 Hyperlipidemia, unspecified: Secondary | ICD-10-CM | POA: Diagnosis not present

## 2020-07-23 DIAGNOSIS — M1990 Unspecified osteoarthritis, unspecified site: Secondary | ICD-10-CM | POA: Diagnosis not present

## 2020-07-23 DIAGNOSIS — E1165 Type 2 diabetes mellitus with hyperglycemia: Secondary | ICD-10-CM | POA: Diagnosis not present

## 2020-07-23 DIAGNOSIS — R3911 Hesitancy of micturition: Secondary | ICD-10-CM | POA: Diagnosis not present

## 2020-08-19 DIAGNOSIS — G4733 Obstructive sleep apnea (adult) (pediatric): Secondary | ICD-10-CM | POA: Diagnosis not present

## 2020-08-28 DIAGNOSIS — H02403 Unspecified ptosis of bilateral eyelids: Secondary | ICD-10-CM | POA: Diagnosis not present

## 2020-08-28 DIAGNOSIS — H02831 Dermatochalasis of right upper eyelid: Secondary | ICD-10-CM | POA: Diagnosis not present

## 2020-08-28 DIAGNOSIS — H02834 Dermatochalasis of left upper eyelid: Secondary | ICD-10-CM | POA: Diagnosis not present

## 2020-09-18 DIAGNOSIS — G4733 Obstructive sleep apnea (adult) (pediatric): Secondary | ICD-10-CM | POA: Diagnosis not present

## 2020-10-01 DIAGNOSIS — U071 COVID-19: Secondary | ICD-10-CM | POA: Diagnosis not present

## 2020-10-19 DIAGNOSIS — G4733 Obstructive sleep apnea (adult) (pediatric): Secondary | ICD-10-CM | POA: Diagnosis not present

## 2020-10-25 DIAGNOSIS — G4733 Obstructive sleep apnea (adult) (pediatric): Secondary | ICD-10-CM | POA: Diagnosis not present

## 2020-10-25 DIAGNOSIS — N401 Enlarged prostate with lower urinary tract symptoms: Secondary | ICD-10-CM | POA: Diagnosis not present

## 2020-10-25 DIAGNOSIS — D693 Immune thrombocytopenic purpura: Secondary | ICD-10-CM | POA: Diagnosis not present

## 2020-10-25 DIAGNOSIS — M1990 Unspecified osteoarthritis, unspecified site: Secondary | ICD-10-CM | POA: Diagnosis not present

## 2020-10-25 DIAGNOSIS — E785 Hyperlipidemia, unspecified: Secondary | ICD-10-CM | POA: Diagnosis not present

## 2020-10-25 DIAGNOSIS — E1165 Type 2 diabetes mellitus with hyperglycemia: Secondary | ICD-10-CM | POA: Diagnosis not present

## 2020-10-25 DIAGNOSIS — I10 Essential (primary) hypertension: Secondary | ICD-10-CM | POA: Diagnosis not present

## 2020-10-25 DIAGNOSIS — R3911 Hesitancy of micturition: Secondary | ICD-10-CM | POA: Diagnosis not present

## 2020-10-25 DIAGNOSIS — Z794 Long term (current) use of insulin: Secondary | ICD-10-CM | POA: Diagnosis not present

## 2020-10-27 DIAGNOSIS — G4733 Obstructive sleep apnea (adult) (pediatric): Secondary | ICD-10-CM | POA: Diagnosis not present

## 2020-11-07 DIAGNOSIS — E119 Type 2 diabetes mellitus without complications: Secondary | ICD-10-CM | POA: Diagnosis not present

## 2020-11-07 DIAGNOSIS — I10 Essential (primary) hypertension: Secondary | ICD-10-CM | POA: Diagnosis not present

## 2020-11-07 DIAGNOSIS — H02401 Unspecified ptosis of right eyelid: Secondary | ICD-10-CM | POA: Diagnosis not present

## 2020-11-07 DIAGNOSIS — H02831 Dermatochalasis of right upper eyelid: Secondary | ICD-10-CM | POA: Diagnosis not present

## 2020-11-07 DIAGNOSIS — H02834 Dermatochalasis of left upper eyelid: Secondary | ICD-10-CM | POA: Diagnosis not present

## 2020-11-15 DIAGNOSIS — Z79899 Other long term (current) drug therapy: Secondary | ICD-10-CM | POA: Diagnosis not present

## 2020-11-15 DIAGNOSIS — Z48810 Encounter for surgical aftercare following surgery on the sense organs: Secondary | ICD-10-CM | POA: Diagnosis not present

## 2020-11-19 DIAGNOSIS — G4733 Obstructive sleep apnea (adult) (pediatric): Secondary | ICD-10-CM | POA: Diagnosis not present

## 2020-12-19 DIAGNOSIS — G4733 Obstructive sleep apnea (adult) (pediatric): Secondary | ICD-10-CM | POA: Diagnosis not present

## 2021-01-11 DIAGNOSIS — Z48810 Encounter for surgical aftercare following surgery on the sense organs: Secondary | ICD-10-CM | POA: Diagnosis not present

## 2021-02-05 DIAGNOSIS — G4733 Obstructive sleep apnea (adult) (pediatric): Secondary | ICD-10-CM | POA: Diagnosis not present

## 2021-02-05 DIAGNOSIS — E785 Hyperlipidemia, unspecified: Secondary | ICD-10-CM | POA: Diagnosis not present

## 2021-02-05 DIAGNOSIS — Z794 Long term (current) use of insulin: Secondary | ICD-10-CM | POA: Diagnosis not present

## 2021-02-05 DIAGNOSIS — N401 Enlarged prostate with lower urinary tract symptoms: Secondary | ICD-10-CM | POA: Diagnosis not present

## 2021-02-05 DIAGNOSIS — M1990 Unspecified osteoarthritis, unspecified site: Secondary | ICD-10-CM | POA: Diagnosis not present

## 2021-02-05 DIAGNOSIS — I10 Essential (primary) hypertension: Secondary | ICD-10-CM | POA: Diagnosis not present

## 2021-02-05 DIAGNOSIS — E1165 Type 2 diabetes mellitus with hyperglycemia: Secondary | ICD-10-CM | POA: Diagnosis not present

## 2021-02-05 DIAGNOSIS — R3911 Hesitancy of micturition: Secondary | ICD-10-CM | POA: Diagnosis not present

## 2021-02-05 DIAGNOSIS — D693 Immune thrombocytopenic purpura: Secondary | ICD-10-CM | POA: Diagnosis not present

## 2021-04-11 DIAGNOSIS — B9689 Other specified bacterial agents as the cause of diseases classified elsewhere: Secondary | ICD-10-CM | POA: Diagnosis not present

## 2021-04-11 DIAGNOSIS — J019 Acute sinusitis, unspecified: Secondary | ICD-10-CM | POA: Diagnosis not present

## 2021-05-07 DIAGNOSIS — D693 Immune thrombocytopenic purpura: Secondary | ICD-10-CM | POA: Diagnosis not present

## 2021-05-07 DIAGNOSIS — E785 Hyperlipidemia, unspecified: Secondary | ICD-10-CM | POA: Diagnosis not present

## 2021-05-07 DIAGNOSIS — Z794 Long term (current) use of insulin: Secondary | ICD-10-CM | POA: Diagnosis not present

## 2021-05-07 DIAGNOSIS — E039 Hypothyroidism, unspecified: Secondary | ICD-10-CM | POA: Diagnosis not present

## 2021-05-07 DIAGNOSIS — Z1331 Encounter for screening for depression: Secondary | ICD-10-CM | POA: Diagnosis not present

## 2021-05-07 DIAGNOSIS — M1990 Unspecified osteoarthritis, unspecified site: Secondary | ICD-10-CM | POA: Diagnosis not present

## 2021-05-07 DIAGNOSIS — F320 Major depressive disorder, single episode, mild: Secondary | ICD-10-CM | POA: Diagnosis not present

## 2021-05-07 DIAGNOSIS — Z9181 History of falling: Secondary | ICD-10-CM | POA: Diagnosis not present

## 2021-05-07 DIAGNOSIS — I10 Essential (primary) hypertension: Secondary | ICD-10-CM | POA: Diagnosis not present

## 2021-05-07 DIAGNOSIS — Z139 Encounter for screening, unspecified: Secondary | ICD-10-CM | POA: Diagnosis not present

## 2021-05-07 DIAGNOSIS — E1165 Type 2 diabetes mellitus with hyperglycemia: Secondary | ICD-10-CM | POA: Diagnosis not present

## 2021-06-13 DIAGNOSIS — Z794 Long term (current) use of insulin: Secondary | ICD-10-CM | POA: Diagnosis not present

## 2021-06-13 DIAGNOSIS — H524 Presbyopia: Secondary | ICD-10-CM | POA: Diagnosis not present

## 2021-06-13 DIAGNOSIS — Z961 Presence of intraocular lens: Secondary | ICD-10-CM | POA: Diagnosis not present

## 2021-06-13 DIAGNOSIS — H35013 Changes in retinal vascular appearance, bilateral: Secondary | ICD-10-CM | POA: Diagnosis not present

## 2021-06-13 DIAGNOSIS — E113293 Type 2 diabetes mellitus with mild nonproliferative diabetic retinopathy without macular edema, bilateral: Secondary | ICD-10-CM | POA: Diagnosis not present

## 2021-07-26 DIAGNOSIS — N401 Enlarged prostate with lower urinary tract symptoms: Secondary | ICD-10-CM | POA: Diagnosis not present

## 2021-07-26 DIAGNOSIS — R3912 Poor urinary stream: Secondary | ICD-10-CM | POA: Diagnosis not present

## 2021-07-26 DIAGNOSIS — N200 Calculus of kidney: Secondary | ICD-10-CM | POA: Diagnosis not present

## 2021-08-12 DIAGNOSIS — I10 Essential (primary) hypertension: Secondary | ICD-10-CM | POA: Diagnosis not present

## 2021-08-12 DIAGNOSIS — N401 Enlarged prostate with lower urinary tract symptoms: Secondary | ICD-10-CM | POA: Diagnosis not present

## 2021-08-12 DIAGNOSIS — G4733 Obstructive sleep apnea (adult) (pediatric): Secondary | ICD-10-CM | POA: Diagnosis not present

## 2021-08-12 DIAGNOSIS — R3911 Hesitancy of micturition: Secondary | ICD-10-CM | POA: Diagnosis not present

## 2021-08-12 DIAGNOSIS — E785 Hyperlipidemia, unspecified: Secondary | ICD-10-CM | POA: Diagnosis not present

## 2021-08-12 DIAGNOSIS — M1990 Unspecified osteoarthritis, unspecified site: Secondary | ICD-10-CM | POA: Diagnosis not present

## 2021-08-12 DIAGNOSIS — Z794 Long term (current) use of insulin: Secondary | ICD-10-CM | POA: Diagnosis not present

## 2021-08-12 DIAGNOSIS — E1165 Type 2 diabetes mellitus with hyperglycemia: Secondary | ICD-10-CM | POA: Diagnosis not present

## 2021-08-12 DIAGNOSIS — D693 Immune thrombocytopenic purpura: Secondary | ICD-10-CM | POA: Diagnosis not present

## 2021-08-14 DIAGNOSIS — M25561 Pain in right knee: Secondary | ICD-10-CM | POA: Diagnosis not present

## 2021-08-14 DIAGNOSIS — M1712 Unilateral primary osteoarthritis, left knee: Secondary | ICD-10-CM | POA: Diagnosis not present

## 2021-08-14 DIAGNOSIS — G8929 Other chronic pain: Secondary | ICD-10-CM | POA: Diagnosis not present

## 2021-09-11 DIAGNOSIS — D693 Immune thrombocytopenic purpura: Secondary | ICD-10-CM | POA: Diagnosis not present

## 2021-10-09 ENCOUNTER — Other Ambulatory Visit: Payer: Self-pay | Admitting: Oncology

## 2021-10-09 DIAGNOSIS — D696 Thrombocytopenia, unspecified: Secondary | ICD-10-CM

## 2021-10-09 NOTE — Progress Notes (Signed)
[facility]  [address] [place],  [place]  [address] [phone]  Clinic Day:  10/09/2021  Referring physician: Lowella Dandy, NP   HISTORY OF PRESENT ILLNESS:  The patient is a 66 y.o. male  who I was asked to consult upon for thrombocytopenia.  Labs in July 2023 showed a low platelet count of 58.   A CBC in May 2023 showed a low platelet count of 72.  Of note, he also had a recently low white count of 2.5.  PAST MEDICAL HISTORY:   Past Medical History:  Diagnosis Date  . Arthritis    shoulder ,knees and hands  . Cataracts, bilateral    immature  . Complication of anesthesia    "woke up" and was resistant during ureteroscopy for kidney stone retrieval many years ago  . Diabetes mellitus without complication (HCC)    Novolin 70/30  . Dry eyes    uses Clear Eyes daily as needed  . Enlarged prostate    takes Urinozinc bid  . Gallstones   . History of colon polyps   . History of kidney stones   . History of migraine    unknown to when last one was  . Hyperlipidemia    takes Niacin daily  . Hypertension    takes Metoprolol daily  . Joint pain   . Multiple gastric ulcers    many,many yrs ago  . Sleep apnea    sleep study done at least 61yr ago and uses CPAP  . Urinary frequency   . Urinary urgency   . Vertigo    but no meds required    PAST SURGICAL HISTORY:   Past Surgical History:  Procedure Laterality Date  . barium enema  374yrago  . CARPAL TUNNEL RELEASE Bilateral   . COLONOSCOPY    . CYSTOSCOPY    . ESOPHAGOGASTRODUODENOSCOPY    . KNEE ARTHROSCOPY Right   . LITHOTRIPSY     x 3  . SHOULDER ARTHROSCOPY Right   . SHOULDER ARTHROSCOPY WITH ROTATOR CUFF REPAIR AND SUBACROMIAL DECOMPRESSION Left 05/05/2013   Procedure: LEFT SHOULDER ARTHROSCOPY WITH ROTATOR CUFF REPAIR AND SUBACROMIAL DECOMPRESSION, DISTAL CLAVICLE RESECTION;  Surgeon: KeMarin ShutterMD;  [facility];  Laterality: Left;  . TRIGGER  FINGER RELEASE     x 3    CURRENT MEDICATIONS:   Current Outpatient Medications  Medication Sig Dispense Refill  . aspirin EC 81 MG tablet Take 1 tablet (81 mg total) by mouth daily.    . diazepam (VALIUM) 5 MG tablet Take 0.5-1 tablets (2.5-5 mg total) by mouth every 6 (six) hours as needed for muscle spasms or sedation. 40 tablet 1  . HYDROmorphone (DILAUDID) 2 MG tablet Take 1-2 tablets (2-4 mg total) by mouth every 4 (four) hours as needed. 50 tablet 0  . Hyprom-Naphaz-Polysorb-Zn Sulf (CLEAR EYES COMPLETE OP) Apply 1 drop to eye 4 (four) times daily as needed (for dry eyes).    . insulin NPH-regular Human (NOVOLIN 70/30) (70-30) 100 UNIT/ML injection Inject 100 Units into the skin 2 (two) times daily with a meal.     . meloxicam (MOBIC) 15 MG tablet Take 1 tablet (15 mg total) by mouth daily.    . metoprolol tartrate (LOPRESSOR) 25 MG tablet Take 25 mg by mouth 2 (two) times daily.    . Misc Natural Products (URINOZINC PLUS) TABS Take 2 capsules by mouth daily.    . Multiple Vitamin (MULTIVITAMIN WITH  MINERALS) TABS tablet Take 1 tablet by mouth daily.    . niacin 500 MG CR capsule Take 500 mg by mouth daily.    . Omega 3 1200 MG CAPS Take 1 capsule by mouth 2 (two) times daily.     Marland Kitchen oxyCODONE-acetaminophen (PERCOCET) 5-325 MG per tablet Take 1-2 tablets by mouth every 4 (four) hours as needed. 50 tablet 0  . oxyCODONE-acetaminophen (ROXICET) 5-325 MG per tablet Take 1-2 tablets by mouth every 4 (four) hours as needed for severe pain. 30 tablet 0   No current facility-administered medications for this visit.    ALLERGIES:   Allergies  Allergen Reactions  . Ciprofloxacin Itching    Hives  . Penicillins Swelling and Rash    FAMILY HISTORY:  No family history on file.  SOCIAL HISTORY:   reports that he has quit smoking. His smokeless tobacco use includes chew. He reports current alcohol use. He reports that he does not use drugs.  REVIEW OF SYSTEMS:  Review of Systems -  Oncology   PHYSICAL EXAM:  There were no vitals taken for this visit. Wt Readings from Last 3 Encounters:  04/27/13 245 lb 12.8 oz (111.5 kg)  03/24/13 235 lb 4 oz (106.7 kg)   There is no height or weight on file to calculate BMI. Performance status (ECOG): {CHL ONC [id number] Physical Exam  LABS:      Latest Ref Rng & Units 04/27/2013    1:33 PM 12/12/2009    7:51 AM  CBC  WBC 4.0 - 10.5 K/uL 5.1    Hemoglobin 13.0 - 17.0 g/dL 16.1  15.6   Hematocrit 39.0 - 52.0 % 44.4  46.0   Platelets 150 - 400 K/uL 102        Latest Ref Rng & Units 04/27/2013    1:33 PM 12/12/2009    7:51 AM 12/10/2009   12:00 PM  CMP  Glucose 70 - 99 mg/dL 322  264  204   BUN 6 - 23 mg/dL '16  19  16   '$ Creatinine 0.50 - 1.35 mg/dL 0.90  1.0  1.32   Sodium 137 - 147 mEq/L 138  141  139   Potassium 3.7 - 5.3 mEq/L 4.8  3.7  4.6   Chloride 96 - 112 mEq/L 100  106  105   CO2 19 - 32 mEq/L 25   30   Calcium 8.4 - 10.5 mg/dL 9.6   9.3   Total Protein 6.0 - 8.3 g/dL 7.1     Total Bilirubin 0.3 - 1.2 mg/dL 0.7     Alkaline Phos 39 - 117 U/L 150     AST 0 - 37 U/L 58     ALT 0 - 53 U/L 76        No results found for: "CEA1", "CEA" / No results found for: "CEA1", "CEA" No results found for: "PSA1" No results found for: "QPY195" No results found for: "CAN125"  No results found for: "TOTALPROTELP", "ALBUMINELP", "A1GS", "A2GS", "BETS", "BETA2SER", "GAMS", "MSPIKE", "SPEI" No results found for: "TIBC", "FERRITIN", "IRONPCTSAT" No results found for: "LDH"  No results found for: "AFPTUMOR", "TOTALPROTELP", "ALBUMINELP", "A1GS", "A2GS", "BETS", "BETA2SER", "GAMS", "MSPIKE", "SPEI", "LDH", "CEA1", "CEA", "PSA1", "IGASERUM", "IGGSERUM", "IGMSERUM", "THGAB", "THYROGLB"  Review Flowsheet        No data to display          STUDIES:  No results found.   ASSESSMENT & PLAN:  A 66 y.o. male who I was asked to consult upon  for *** .The patient understands all the plans discussed today and is in  agreement with them.  I do appreciate Moon, Amy A, NP for his new consult.   Michaelyn Wall Macarthur Critchley, MD

## 2021-10-10 ENCOUNTER — Inpatient Hospital Stay: Payer: Medicare HMO | Admitting: Oncology

## 2021-10-10 ENCOUNTER — Inpatient Hospital Stay: Payer: Medicare HMO | Attending: Oncology

## 2021-10-10 ENCOUNTER — Encounter: Payer: Self-pay | Admitting: Oncology

## 2021-10-10 ENCOUNTER — Other Ambulatory Visit: Payer: Self-pay | Admitting: Oncology

## 2021-10-10 ENCOUNTER — Other Ambulatory Visit: Payer: Self-pay

## 2021-10-10 DIAGNOSIS — D696 Thrombocytopenia, unspecified: Secondary | ICD-10-CM

## 2021-10-10 DIAGNOSIS — Z87891 Personal history of nicotine dependence: Secondary | ICD-10-CM | POA: Insufficient documentation

## 2021-10-10 DIAGNOSIS — Z794 Long term (current) use of insulin: Secondary | ICD-10-CM | POA: Diagnosis not present

## 2021-10-10 DIAGNOSIS — Z79899 Other long term (current) drug therapy: Secondary | ICD-10-CM | POA: Diagnosis not present

## 2021-10-10 DIAGNOSIS — D72819 Decreased white blood cell count, unspecified: Secondary | ICD-10-CM | POA: Diagnosis not present

## 2021-10-10 DIAGNOSIS — D693 Immune thrombocytopenic purpura: Secondary | ICD-10-CM | POA: Diagnosis not present

## 2021-10-10 LAB — BASIC METABOLIC PANEL
BUN: 23 — AB (ref 4–21)
CO2: 26 — AB (ref 13–22)
Chloride: 104 (ref 99–108)
Creatinine: 1.3 (ref 0.6–1.3)
Glucose: 321
Potassium: 4.3 mEq/L (ref 3.5–5.1)
Sodium: 136 — AB (ref 137–147)

## 2021-10-10 LAB — HEPATIC FUNCTION PANEL
ALT: 27 U/L (ref 10–40)
AST: 35 (ref 14–40)
Alkaline Phosphatase: 154 — AB (ref 25–125)
Bilirubin, Total: 0.9

## 2021-10-10 LAB — COMPREHENSIVE METABOLIC PANEL
Albumin: 3.8 (ref 3.5–5.0)
Calcium: 9.1 (ref 8.7–10.7)

## 2021-10-10 LAB — CBC AND DIFFERENTIAL
HCT: 46 (ref 41–53)
Hemoglobin: 15.1 (ref 13.5–17.5)
Neutrophils Absolute: 1.71
Platelets: 57 10*3/uL — AB (ref 150–400)
WBC: 2.8

## 2021-10-10 LAB — TSH: TSH: 4.109 u[IU]/mL (ref 0.350–4.500)

## 2021-10-10 LAB — CBC: RBC: 5.08 (ref 3.87–5.11)

## 2021-10-11 ENCOUNTER — Inpatient Hospital Stay: Payer: Medicare HMO | Admitting: Licensed Clinical Social Worker

## 2021-10-11 DIAGNOSIS — D696 Thrombocytopenia, unspecified: Secondary | ICD-10-CM

## 2021-10-11 NOTE — Progress Notes (Signed)
[facility]  Initial Assessment   Austin Robertson is a 66 y.o. year old male contacted by phone. Clinical Social Work was referred by medical provider for assessment of psychosocial needs.   SDOH (Social Determinants of Health) assessments performed: Yes   SDOH Screenings   Alcohol Screen: Not on file  Depression (HMC9-4): Not on file  Financial Resource Strain: Not on file  Food Insecurity: Not on file  Housing: Not on file  Physical Activity: Not on file  Social Connections: Not on file  Stress: Not on file  Tobacco Use: High Risk (10/10/2021)   Patient History    Smoking Tobacco Use: Former    Smokeless Tobacco Use: Current    Passive Exposure: Not on file  Transportation Needs: Not on file     Distress Screen completed: Yes    10/10/2021   11:55 AM  ONCBCN DISTRESS SCREENING  Screening Type Initial Screening  Distress experienced in past week (1-10) 5  Family Problem type Partner      Family/Social Information:  Housing Arrangement: patient lives with spouse,  Austin Robertson, Austin Robertson [phone].  Patient is spouse's primary caregiver, spouse has difficulty with ADLs/IADLs Family members/support persons in your life? Family and Patient has limited social and familial support. Transportation concerns: no  Employment: Working full time Surveyor, [employer].  Income source: Employment Financial concerns:  Patient did not identify and financial concerns. Type of concern: None Food access concerns: no Religious or spiritual practice:  Services Currently in place:  [employer]  Coping/ Adjustment to diagnosis: Patient understands treatment plan and what happens next? no, patient stated he is not entirely sure what his diagnosis means in terms of long term health effects. Concerns about diagnosis and/or treatment: How I will care for other members of my family, How I will pay for the services I need, and How will I care for myself Patient reported stressors: Childcare/ elder  care Hopes and/or priorities: N/A Patient enjoys  N/A Current coping skills/ strengths: Ability for insight , Active sense of humor , Average or above average intelligence , Capable of independent living , Communication skills , Financial means , General fund of knowledge , and Work skills     SUMMARY: Current SDOH Barriers:  Financial constraints related to one income household, Limited social support, Level of care concerns, Social Isolation, and patient is main caregiver for spouse   Clinical Social Work Clinical Goal(s):  Patient stated he does not need CSW assistance at this moment.  Interventions: Discussed common feeling and emotions when being diagnosed with cancer, and the importance of support during treatment Informed patient of the support team roles and support services at [facility] Provided CSW contact information and encouraged patient to call with any questions or concerns Provided patient with information about CSW role in patient care.   Follow Up Plan: Patient will contact CSW with any support or resource needs Patient verbalizes understanding of plan: Yes    Verneal Wiers, LCSW

## 2021-10-17 DIAGNOSIS — K8020 Calculus of gallbladder without cholecystitis without obstruction: Secondary | ICD-10-CM | POA: Diagnosis not present

## 2021-10-17 DIAGNOSIS — K7460 Unspecified cirrhosis of liver: Secondary | ICD-10-CM | POA: Diagnosis not present

## 2021-10-17 DIAGNOSIS — N200 Calculus of kidney: Secondary | ICD-10-CM | POA: Diagnosis not present

## 2021-10-17 DIAGNOSIS — I700 Atherosclerosis of aorta: Secondary | ICD-10-CM | POA: Diagnosis not present

## 2021-10-18 ENCOUNTER — Inpatient Hospital Stay: Payer: Medicare HMO | Admitting: Oncology

## 2021-10-18 ENCOUNTER — Other Ambulatory Visit: Payer: Self-pay | Admitting: Oncology

## 2021-10-18 VITALS — BP 166/75 | HR 65 | Temp 99.0°F | Resp 18 | Ht 67.0 in | Wt 252.1 lb

## 2021-10-18 DIAGNOSIS — D696 Thrombocytopenia, unspecified: Secondary | ICD-10-CM | POA: Diagnosis not present

## 2021-10-21 DIAGNOSIS — K760 Fatty (change of) liver, not elsewhere classified: Secondary | ICD-10-CM | POA: Diagnosis not present

## 2021-10-21 DIAGNOSIS — E1165 Type 2 diabetes mellitus with hyperglycemia: Secondary | ICD-10-CM | POA: Diagnosis not present

## 2021-10-21 DIAGNOSIS — Z794 Long term (current) use of insulin: Secondary | ICD-10-CM | POA: Diagnosis not present

## 2021-10-21 DIAGNOSIS — Z713 Dietary counseling and surveillance: Secondary | ICD-10-CM | POA: Diagnosis not present

## 2021-10-21 DIAGNOSIS — Z6841 Body Mass Index (BMI) 40.0 and over, adult: Secondary | ICD-10-CM | POA: Diagnosis not present

## 2021-10-22 NOTE — Progress Notes (Signed)
[facility]  [address] [place],    [address] [phone]  Clinic Day:  10/18/2021  Referring physician: Lowella Dandy, NP   HISTORY OF PRESENT ILLNESS:  The patient is a 66 y.o. male  who I recently began saying for thrombocytopenia.  Due to my concern that his low platelets were due to underlying liver disease, a CT scan of his abdomen/pelvis was done for further evaluation.  He comes back in today to go over his CT scan images and the implications.  Since his last visit, the patient's been doing okay.  Despite his thrombocytopenia, he denies having any subcutaneous bleeding or bruising issues since his last visit.  PHYSICAL EXAM:  Blood pressure (!) 166/75, pulse 65, temperature 99 F (37.2 C), resp. rate 18, height '5\' 7"'$  (1.702 m), weight 252 lb 1.6 oz (114.4 kg), SpO2 93 %. Wt Readings from Last 3 Encounters:  10/18/21 252 lb 1.6 oz (114.4 kg)  10/10/21 252 lb (114.3 kg)  04/27/13 245 lb 12.8 oz (111.5 kg)   Body mass index is 39.48 kg/m. Performance status (ECOG): 1 - Symptomatic but completely ambulatory Physical Exam Constitutional:      Appearance: Normal appearance. He is not ill-appearing.  HENT:     Mouth/Throat:     Mouth: Mucous membranes are moist.     Pharynx: Oropharynx is clear. No oropharyngeal exudate or posterior oropharyngeal erythema.  Cardiovascular:     Rate and Rhythm: Normal rate and regular rhythm.     Heart sounds: No murmur heard.    No friction rub. No gallop.  Pulmonary:     Effort: Pulmonary effort is normal. No respiratory distress.     Breath sounds: Normal breath sounds. No wheezing, rhonchi or rales.  Abdominal:     General: Bowel sounds are normal. There is no distension.     Palpations: Abdomen is soft. There is no mass.     Tenderness: There is no abdominal tenderness.  Musculoskeletal:        General: No swelling.     Right lower leg: No edema.     Left lower leg: No edema.   Lymphadenopathy:     Cervical: No cervical adenopathy.     Upper Body:     Right upper body: No supraclavicular or axillary adenopathy.     Left upper body: No supraclavicular or axillary adenopathy.     Lower Body: No right inguinal adenopathy. No left inguinal adenopathy.  Skin:    General: Skin is warm.     Coloration: Skin is not jaundiced.     Findings: No lesion or rash.  Neurological:     General: No focal deficit present.     Mental Status: He is alert and oriented to person, place, and time. Mental status is at baseline.  Psychiatric:        Mood and Affect: Mood normal.        Behavior: Behavior normal.        Thought Content: Thought content normal.    SCANS: CT scans of his abdomen/pelvis revealed the following:  FINDINGS: Lower chest: No significant pulmonary nodules or acute consolidative airspace disease.  Hepatobiliary: Diffusely irregular liver surface with prominent relative hypertrophy of the lateral segment left liver lobe, compatible with cirrhosis. No arterial foci of liver hyperenhancement. No liver masses. Cholelithiasis. No biliary ductal dilatation.  Pancreas: Normal, with no mass or duct dilation.  Spleen: Mild splenomegaly. Craniocaudal splenic length 14.6 center, increased from 13.0  cm. No splenic masses.  Adrenals/Urinary Tract: Normal adrenals. Nonobstructing 4 mm upper, 6 mm interpolar and 3 mm lower right renal stones. Nonobstructing 3 mm interpolar left renal stone. No hydronephrosis. Simple 1.1 cm upper left renal cyst, for which no follow-up imaging is recommended. No suspicious renal masses. Normal caliber ureters. No ureteral stones. Normal bladder.  Stomach/Bowel: Normal non-distended stomach. Normal caliber small bowel with no small bowel wall thickening. Normal appendix. Oral contrast transits to the colon. Normal large bowel with no diverticulosis, large bowel wall thickening or pericolonic  fat stranding.  Vascular/Lymphatic: Atherosclerotic nonaneurysmal abdominal aorta. Patent portal, splenic, hepatic and renal veins. Mildly enlarged 1.0 cm porta hepatis node (series 302/image 51), stable since 03/19/2013, most compatible with benign reactive etiology. No additional pathologically enlarged lymph nodes in the abdomen or pelvis.  Reproductive: Marked prostatomegaly with nonspecific internal curvilinear heterogeneous coarse calcifications.  Other: No pneumoperitoneum, ascites or focal fluid collection.  Musculoskeletal: No aggressive appearing focal osseous lesions. Moderate thoracolumbar spondylosis.  IMPRESSION: 1. Cirrhosis. No liver masses. 2. Mild splenomegaly, increased. No ascites. 3. Cholelithiasis. 4. Nonobstructing bilateral nephrolithiasis. No hydronephrosis. 5. Marked prostatomegaly. 6. Aortic Atherosclerosis (ICD10-I70.0).  LABS:      Latest Ref Rng & Units 10/10/2021   12:00 AM 04/27/2013    1:33 PM 12/12/2009    7:51 AM  CBC  WBC  2.8     5.1    Hemoglobin 13.5 - 17.5 15.1     16.1  15.6   Hematocrit 41 - 53 46     44.4  46.0   Platelets 150 - 400 K/uL 57     102       This result is from an external source.       Latest Ref Rng & Units 10/10/2021   12:00 AM 04/27/2013    1:33 PM 12/12/2009    7:51 AM  CMP  Glucose 70 - 99 mg/dL  322  264   BUN 4 - '21 23     16  19   '$ Creatinine 0.6 - 1.3 1.3     0.90  1.0   Sodium 137 - 147 136     138  141   Potassium 3.5 - 5.1 mEq/L 4.3     4.8  3.7   Chloride 99 - 108 104     100  106   CO2 13 - '22 26     25    '$ Calcium 8.7 - 10.7 9.1     9.6    Total Protein 6.0 - 8.3 g/dL  7.1    Total Bilirubin 0.3 - 1.2 mg/dL  0.7    Alkaline Phos 25 - 125 154     150    AST 14 - 40 35     58    ALT 10 - 40 U/L 27     76       This result is from an external source.    ASSESSMENT & PLAN:  A 66 y.o. male who I was asked to consult upon for thrombocytopenia.  In clinic today, I reviewed his CT scans with  him, for which he can see that he has a cirrhotic liver, as well as splenomegaly.  This definitely appears to be the reason behind his thrombocytopenia.  Based upon this, I will have him see a liver specialist for further evaluation.  Despite him having a low platelet count, they are not severely low to where bleeding/bruising issues should develop.  For now,  his thrombocytopenia will be followed conservatively.  I will see him back in 6 months for repeat clinical assessment.  The patient understands all the plans discussed today and is in agreement with them.  I do appreciate Moon, Amy A, NP for his new consult.   Chester Romero Macarthur Critchley, MD

## 2021-10-28 ENCOUNTER — Encounter: Payer: Self-pay | Admitting: Oncology

## 2021-10-29 NOTE — Progress Notes (Signed)
Reached out to patient to see if I could assist him with any questions or resources regarding financial concerns. He stated that he does not have any concerns at this time. He knows he can contact me if anything comes up in the future.

## 2021-11-21 DIAGNOSIS — M1990 Unspecified osteoarthritis, unspecified site: Secondary | ICD-10-CM | POA: Diagnosis not present

## 2021-11-21 DIAGNOSIS — G4733 Obstructive sleep apnea (adult) (pediatric): Secondary | ICD-10-CM | POA: Diagnosis not present

## 2021-11-21 DIAGNOSIS — E785 Hyperlipidemia, unspecified: Secondary | ICD-10-CM | POA: Diagnosis not present

## 2021-11-21 DIAGNOSIS — K7460 Unspecified cirrhosis of liver: Secondary | ICD-10-CM | POA: Diagnosis not present

## 2021-11-21 DIAGNOSIS — N401 Enlarged prostate with lower urinary tract symptoms: Secondary | ICD-10-CM | POA: Diagnosis not present

## 2021-11-21 DIAGNOSIS — I10 Essential (primary) hypertension: Secondary | ICD-10-CM | POA: Diagnosis not present

## 2021-11-21 DIAGNOSIS — Z794 Long term (current) use of insulin: Secondary | ICD-10-CM | POA: Diagnosis not present

## 2021-11-21 DIAGNOSIS — R3911 Hesitancy of micturition: Secondary | ICD-10-CM | POA: Diagnosis not present

## 2021-11-21 DIAGNOSIS — E1165 Type 2 diabetes mellitus with hyperglycemia: Secondary | ICD-10-CM | POA: Diagnosis not present

## 2021-11-21 DIAGNOSIS — D693 Immune thrombocytopenic purpura: Secondary | ICD-10-CM | POA: Diagnosis not present

## 2021-11-26 DIAGNOSIS — K7460 Unspecified cirrhosis of liver: Secondary | ICD-10-CM | POA: Diagnosis not present

## 2021-11-26 DIAGNOSIS — R1084 Generalized abdominal pain: Secondary | ICD-10-CM | POA: Diagnosis not present

## 2021-11-26 DIAGNOSIS — R7989 Other specified abnormal findings of blood chemistry: Secondary | ICD-10-CM | POA: Diagnosis not present

## 2021-11-26 DIAGNOSIS — R110 Nausea: Secondary | ICD-10-CM | POA: Diagnosis not present

## 2021-11-27 DIAGNOSIS — R1084 Generalized abdominal pain: Secondary | ICD-10-CM | POA: Diagnosis not present

## 2021-11-27 DIAGNOSIS — K7460 Unspecified cirrhosis of liver: Secondary | ICD-10-CM | POA: Diagnosis not present

## 2021-11-27 DIAGNOSIS — K8020 Calculus of gallbladder without cholecystitis without obstruction: Secondary | ICD-10-CM | POA: Diagnosis not present

## 2021-12-06 DIAGNOSIS — K7581 Nonalcoholic steatohepatitis (NASH): Secondary | ICD-10-CM | POA: Diagnosis not present

## 2021-12-06 DIAGNOSIS — K7460 Unspecified cirrhosis of liver: Secondary | ICD-10-CM | POA: Diagnosis not present

## 2021-12-06 DIAGNOSIS — K760 Fatty (change of) liver, not elsewhere classified: Secondary | ICD-10-CM | POA: Diagnosis not present

## 2021-12-06 DIAGNOSIS — G4733 Obstructive sleep apnea (adult) (pediatric): Secondary | ICD-10-CM | POA: Diagnosis not present

## 2021-12-06 DIAGNOSIS — K7682 Hepatic encephalopathy: Secondary | ICD-10-CM | POA: Diagnosis not present

## 2021-12-06 DIAGNOSIS — E785 Hyperlipidemia, unspecified: Secondary | ICD-10-CM | POA: Diagnosis not present

## 2021-12-06 DIAGNOSIS — I10 Essential (primary) hypertension: Secondary | ICD-10-CM | POA: Diagnosis not present

## 2021-12-06 DIAGNOSIS — K5900 Constipation, unspecified: Secondary | ICD-10-CM | POA: Diagnosis not present

## 2021-12-06 DIAGNOSIS — R7989 Other specified abnormal findings of blood chemistry: Secondary | ICD-10-CM | POA: Diagnosis not present

## 2021-12-06 DIAGNOSIS — E119 Type 2 diabetes mellitus without complications: Secondary | ICD-10-CM | POA: Diagnosis not present

## 2021-12-06 DIAGNOSIS — Z794 Long term (current) use of insulin: Secondary | ICD-10-CM | POA: Diagnosis not present

## 2021-12-09 DIAGNOSIS — Z8616 Personal history of COVID-19: Secondary | ICD-10-CM | POA: Diagnosis not present

## 2021-12-09 DIAGNOSIS — I517 Cardiomegaly: Secondary | ICD-10-CM | POA: Diagnosis not present

## 2021-12-09 DIAGNOSIS — K7469 Other cirrhosis of liver: Secondary | ICD-10-CM | POA: Diagnosis not present

## 2021-12-09 DIAGNOSIS — R7989 Other specified abnormal findings of blood chemistry: Secondary | ICD-10-CM | POA: Diagnosis not present

## 2021-12-09 DIAGNOSIS — K7682 Hepatic encephalopathy: Secondary | ICD-10-CM | POA: Diagnosis not present

## 2021-12-09 DIAGNOSIS — N200 Calculus of kidney: Secondary | ICD-10-CM | POA: Diagnosis not present

## 2021-12-09 DIAGNOSIS — K7290 Hepatic failure, unspecified without coma: Secondary | ICD-10-CM | POA: Diagnosis not present

## 2021-12-09 DIAGNOSIS — Z794 Long term (current) use of insulin: Secondary | ICD-10-CM | POA: Diagnosis not present

## 2021-12-09 DIAGNOSIS — K766 Portal hypertension: Secondary | ICD-10-CM | POA: Diagnosis not present

## 2021-12-09 DIAGNOSIS — N179 Acute kidney failure, unspecified: Secondary | ICD-10-CM | POA: Diagnosis not present

## 2021-12-09 DIAGNOSIS — R918 Other nonspecific abnormal finding of lung field: Secondary | ICD-10-CM | POA: Diagnosis not present

## 2021-12-09 DIAGNOSIS — E119 Type 2 diabetes mellitus without complications: Secondary | ICD-10-CM | POA: Diagnosis not present

## 2021-12-09 DIAGNOSIS — I10 Essential (primary) hypertension: Secondary | ICD-10-CM | POA: Diagnosis not present

## 2021-12-09 DIAGNOSIS — K7460 Unspecified cirrhosis of liver: Secondary | ICD-10-CM | POA: Diagnosis not present

## 2021-12-09 DIAGNOSIS — D689 Coagulation defect, unspecified: Secondary | ICD-10-CM | POA: Diagnosis not present

## 2021-12-09 DIAGNOSIS — D61818 Other pancytopenia: Secondary | ICD-10-CM | POA: Diagnosis not present

## 2021-12-10 DIAGNOSIS — K7682 Hepatic encephalopathy: Secondary | ICD-10-CM | POA: Diagnosis not present

## 2021-12-10 DIAGNOSIS — K7460 Unspecified cirrhosis of liver: Secondary | ICD-10-CM | POA: Diagnosis not present

## 2021-12-10 DIAGNOSIS — E119 Type 2 diabetes mellitus without complications: Secondary | ICD-10-CM | POA: Diagnosis not present

## 2021-12-10 DIAGNOSIS — I10 Essential (primary) hypertension: Secondary | ICD-10-CM | POA: Diagnosis not present

## 2021-12-11 DIAGNOSIS — I10 Essential (primary) hypertension: Secondary | ICD-10-CM | POA: Diagnosis not present

## 2021-12-11 DIAGNOSIS — K7469 Other cirrhosis of liver: Secondary | ICD-10-CM | POA: Diagnosis not present

## 2021-12-11 DIAGNOSIS — K766 Portal hypertension: Secondary | ICD-10-CM | POA: Diagnosis not present

## 2021-12-11 DIAGNOSIS — K7682 Hepatic encephalopathy: Secondary | ICD-10-CM | POA: Diagnosis not present

## 2021-12-11 DIAGNOSIS — N179 Acute kidney failure, unspecified: Secondary | ICD-10-CM | POA: Diagnosis not present

## 2021-12-11 DIAGNOSIS — D689 Coagulation defect, unspecified: Secondary | ICD-10-CM | POA: Diagnosis not present

## 2021-12-11 DIAGNOSIS — K7460 Unspecified cirrhosis of liver: Secondary | ICD-10-CM | POA: Diagnosis not present

## 2021-12-11 DIAGNOSIS — D61818 Other pancytopenia: Secondary | ICD-10-CM | POA: Diagnosis not present

## 2021-12-11 DIAGNOSIS — E119 Type 2 diabetes mellitus without complications: Secondary | ICD-10-CM | POA: Diagnosis not present

## 2021-12-11 DIAGNOSIS — K7290 Hepatic failure, unspecified without coma: Secondary | ICD-10-CM | POA: Diagnosis not present

## 2021-12-11 DIAGNOSIS — Z794 Long term (current) use of insulin: Secondary | ICD-10-CM | POA: Diagnosis not present

## 2021-12-11 DIAGNOSIS — R7989 Other specified abnormal findings of blood chemistry: Secondary | ICD-10-CM | POA: Diagnosis not present

## 2021-12-12 DIAGNOSIS — N179 Acute kidney failure, unspecified: Secondary | ICD-10-CM | POA: Diagnosis not present

## 2021-12-12 DIAGNOSIS — D61818 Other pancytopenia: Secondary | ICD-10-CM | POA: Diagnosis not present

## 2021-12-12 DIAGNOSIS — R918 Other nonspecific abnormal finding of lung field: Secondary | ICD-10-CM | POA: Diagnosis not present

## 2021-12-12 DIAGNOSIS — Z794 Long term (current) use of insulin: Secondary | ICD-10-CM | POA: Diagnosis not present

## 2021-12-12 DIAGNOSIS — I10 Essential (primary) hypertension: Secondary | ICD-10-CM | POA: Diagnosis not present

## 2021-12-12 DIAGNOSIS — E119 Type 2 diabetes mellitus without complications: Secondary | ICD-10-CM | POA: Diagnosis not present

## 2021-12-12 DIAGNOSIS — D689 Coagulation defect, unspecified: Secondary | ICD-10-CM | POA: Diagnosis not present

## 2021-12-12 DIAGNOSIS — R7989 Other specified abnormal findings of blood chemistry: Secondary | ICD-10-CM | POA: Diagnosis not present

## 2021-12-12 DIAGNOSIS — K7469 Other cirrhosis of liver: Secondary | ICD-10-CM | POA: Diagnosis not present

## 2021-12-12 DIAGNOSIS — I517 Cardiomegaly: Secondary | ICD-10-CM | POA: Diagnosis not present

## 2021-12-12 DIAGNOSIS — N200 Calculus of kidney: Secondary | ICD-10-CM | POA: Diagnosis not present

## 2021-12-12 DIAGNOSIS — K7460 Unspecified cirrhosis of liver: Secondary | ICD-10-CM | POA: Diagnosis not present

## 2021-12-12 DIAGNOSIS — K7682 Hepatic encephalopathy: Secondary | ICD-10-CM | POA: Diagnosis not present

## 2021-12-13 DIAGNOSIS — Z122 Encounter for screening for malignant neoplasm of respiratory organs: Secondary | ICD-10-CM | POA: Diagnosis not present

## 2021-12-13 DIAGNOSIS — I10 Essential (primary) hypertension: Secondary | ICD-10-CM | POA: Diagnosis not present

## 2021-12-13 DIAGNOSIS — K7110 Toxic liver disease with hepatic necrosis, without coma: Secondary | ICD-10-CM | POA: Diagnosis not present

## 2021-12-13 DIAGNOSIS — K7682 Hepatic encephalopathy: Secondary | ICD-10-CM | POA: Diagnosis not present

## 2021-12-13 DIAGNOSIS — D689 Coagulation defect, unspecified: Secondary | ICD-10-CM | POA: Diagnosis not present

## 2021-12-13 DIAGNOSIS — E119 Type 2 diabetes mellitus without complications: Secondary | ICD-10-CM | POA: Diagnosis not present

## 2021-12-13 DIAGNOSIS — K7581 Nonalcoholic steatohepatitis (NASH): Secondary | ICD-10-CM | POA: Diagnosis not present

## 2021-12-13 DIAGNOSIS — I6523 Occlusion and stenosis of bilateral carotid arteries: Secondary | ICD-10-CM | POA: Diagnosis not present

## 2021-12-13 DIAGNOSIS — I2510 Atherosclerotic heart disease of native coronary artery without angina pectoris: Secondary | ICD-10-CM | POA: Diagnosis not present

## 2021-12-13 DIAGNOSIS — K767 Hepatorenal syndrome: Secondary | ICD-10-CM | POA: Diagnosis not present

## 2021-12-13 DIAGNOSIS — Z01810 Encounter for preprocedural cardiovascular examination: Secondary | ICD-10-CM | POA: Diagnosis not present

## 2021-12-13 DIAGNOSIS — R945 Abnormal results of liver function studies: Secondary | ICD-10-CM | POA: Diagnosis not present

## 2021-12-13 DIAGNOSIS — K7469 Other cirrhosis of liver: Secondary | ICD-10-CM | POA: Diagnosis not present

## 2021-12-13 DIAGNOSIS — D696 Thrombocytopenia, unspecified: Secondary | ICD-10-CM | POA: Diagnosis not present

## 2021-12-13 DIAGNOSIS — Z794 Long term (current) use of insulin: Secondary | ICD-10-CM | POA: Diagnosis not present

## 2021-12-13 DIAGNOSIS — D61818 Other pancytopenia: Secondary | ICD-10-CM | POA: Diagnosis not present

## 2021-12-13 DIAGNOSIS — E1165 Type 2 diabetes mellitus with hyperglycemia: Secondary | ICD-10-CM | POA: Diagnosis not present

## 2021-12-13 DIAGNOSIS — K7210 Chronic hepatic failure without coma: Secondary | ICD-10-CM | POA: Diagnosis not present

## 2021-12-13 DIAGNOSIS — Z01818 Encounter for other preprocedural examination: Secondary | ICD-10-CM | POA: Diagnosis not present

## 2021-12-13 DIAGNOSIS — Z7682 Awaiting organ transplant status: Secondary | ICD-10-CM | POA: Diagnosis not present

## 2021-12-13 DIAGNOSIS — K7460 Unspecified cirrhosis of liver: Secondary | ICD-10-CM | POA: Diagnosis not present

## 2021-12-13 DIAGNOSIS — N179 Acute kidney failure, unspecified: Secondary | ICD-10-CM | POA: Diagnosis not present

## 2021-12-13 DIAGNOSIS — R509 Fever, unspecified: Secondary | ICD-10-CM | POA: Diagnosis not present

## 2021-12-13 DIAGNOSIS — D72819 Decreased white blood cell count, unspecified: Secondary | ICD-10-CM | POA: Diagnosis not present

## 2021-12-13 DIAGNOSIS — K7290 Hepatic failure, unspecified without coma: Secondary | ICD-10-CM | POA: Diagnosis not present

## 2021-12-13 DIAGNOSIS — D72829 Elevated white blood cell count, unspecified: Secondary | ICD-10-CM | POA: Diagnosis not present

## 2021-12-13 DIAGNOSIS — J90 Pleural effusion, not elsewhere classified: Secondary | ICD-10-CM | POA: Diagnosis not present

## 2021-12-13 DIAGNOSIS — J9811 Atelectasis: Secondary | ICD-10-CM | POA: Diagnosis not present

## 2021-12-21 DIAGNOSIS — I10 Essential (primary) hypertension: Secondary | ICD-10-CM | POA: Diagnosis not present

## 2021-12-21 DIAGNOSIS — Z01818 Encounter for other preprocedural examination: Secondary | ICD-10-CM | POA: Diagnosis not present

## 2021-12-21 DIAGNOSIS — K7210 Chronic hepatic failure without coma: Secondary | ICD-10-CM | POA: Diagnosis not present

## 2021-12-21 DIAGNOSIS — Z794 Long term (current) use of insulin: Secondary | ICD-10-CM | POA: Diagnosis not present

## 2021-12-21 DIAGNOSIS — E119 Type 2 diabetes mellitus without complications: Secondary | ICD-10-CM | POA: Diagnosis not present

## 2021-12-21 DIAGNOSIS — K7581 Nonalcoholic steatohepatitis (NASH): Secondary | ICD-10-CM | POA: Diagnosis not present

## 2021-12-21 DIAGNOSIS — K7460 Unspecified cirrhosis of liver: Secondary | ICD-10-CM | POA: Diagnosis not present

## 2021-12-22 DIAGNOSIS — R001 Bradycardia, unspecified: Secondary | ICD-10-CM | POA: Diagnosis not present

## 2021-12-22 DIAGNOSIS — Z01818 Encounter for other preprocedural examination: Secondary | ICD-10-CM | POA: Diagnosis not present

## 2021-12-24 DIAGNOSIS — Z4823 Encounter for aftercare following liver transplant: Secondary | ICD-10-CM | POA: Diagnosis not present

## 2021-12-24 DIAGNOSIS — K819 Cholecystitis, unspecified: Secondary | ICD-10-CM | POA: Diagnosis not present

## 2021-12-24 DIAGNOSIS — R918 Other nonspecific abnormal finding of lung field: Secondary | ICD-10-CM | POA: Diagnosis not present

## 2021-12-24 DIAGNOSIS — Z9911 Dependence on respirator [ventilator] status: Secondary | ICD-10-CM | POA: Diagnosis not present

## 2021-12-24 DIAGNOSIS — R739 Hyperglycemia, unspecified: Secondary | ICD-10-CM | POA: Diagnosis not present

## 2021-12-24 DIAGNOSIS — Z7682 Awaiting organ transplant status: Secondary | ICD-10-CM | POA: Diagnosis not present

## 2021-12-24 DIAGNOSIS — N182 Chronic kidney disease, stage 2 (mild): Secondary | ICD-10-CM | POA: Diagnosis not present

## 2021-12-24 DIAGNOSIS — Z526 Liver donor: Secondary | ICD-10-CM | POA: Diagnosis not present

## 2021-12-24 DIAGNOSIS — Z452 Encounter for adjustment and management of vascular access device: Secondary | ICD-10-CM | POA: Diagnosis not present

## 2021-12-24 DIAGNOSIS — J9589 Other postprocedural complications and disorders of respiratory system, not elsewhere classified: Secondary | ICD-10-CM | POA: Diagnosis not present

## 2021-12-24 DIAGNOSIS — E872 Acidosis, unspecified: Secondary | ICD-10-CM | POA: Diagnosis not present

## 2021-12-24 DIAGNOSIS — C229 Malignant neoplasm of liver, not specified as primary or secondary: Secondary | ICD-10-CM | POA: Diagnosis not present

## 2021-12-24 DIAGNOSIS — N170 Acute kidney failure with tubular necrosis: Secondary | ICD-10-CM | POA: Diagnosis not present

## 2021-12-24 DIAGNOSIS — E1122 Type 2 diabetes mellitus with diabetic chronic kidney disease: Secondary | ICD-10-CM | POA: Diagnosis not present

## 2021-12-24 DIAGNOSIS — J90 Pleural effusion, not elsewhere classified: Secondary | ICD-10-CM | POA: Diagnosis not present

## 2021-12-24 DIAGNOSIS — K766 Portal hypertension: Secondary | ICD-10-CM | POA: Diagnosis not present

## 2021-12-24 DIAGNOSIS — K7210 Chronic hepatic failure without coma: Secondary | ICD-10-CM | POA: Diagnosis not present

## 2021-12-24 DIAGNOSIS — Z794 Long term (current) use of insulin: Secondary | ICD-10-CM | POA: Diagnosis not present

## 2021-12-24 DIAGNOSIS — Z4682 Encounter for fitting and adjustment of non-vascular catheter: Secondary | ICD-10-CM | POA: Diagnosis not present

## 2021-12-24 DIAGNOSIS — R000 Tachycardia, unspecified: Secondary | ICD-10-CM | POA: Diagnosis not present

## 2021-12-24 DIAGNOSIS — I10 Essential (primary) hypertension: Secondary | ICD-10-CM | POA: Diagnosis not present

## 2021-12-24 DIAGNOSIS — Z4822 Encounter for aftercare following kidney transplant: Secondary | ICD-10-CM | POA: Diagnosis not present

## 2021-12-24 DIAGNOSIS — D696 Thrombocytopenia, unspecified: Secondary | ICD-10-CM | POA: Diagnosis not present

## 2021-12-24 DIAGNOSIS — K7581 Nonalcoholic steatohepatitis (NASH): Secondary | ICD-10-CM | POA: Diagnosis not present

## 2021-12-24 DIAGNOSIS — T380X5A Adverse effect of glucocorticoids and synthetic analogues, initial encounter: Secondary | ICD-10-CM | POA: Diagnosis not present

## 2021-12-24 DIAGNOSIS — E8770 Fluid overload, unspecified: Secondary | ICD-10-CM | POA: Diagnosis not present

## 2021-12-24 DIAGNOSIS — D8489 Other immunodeficiencies: Secondary | ICD-10-CM | POA: Diagnosis not present

## 2021-12-24 DIAGNOSIS — N179 Acute kidney failure, unspecified: Secondary | ICD-10-CM | POA: Diagnosis not present

## 2021-12-24 DIAGNOSIS — Z5181 Encounter for therapeutic drug level monitoring: Secondary | ICD-10-CM | POA: Diagnosis not present

## 2021-12-24 DIAGNOSIS — R188 Other ascites: Secondary | ICD-10-CM | POA: Diagnosis not present

## 2021-12-24 DIAGNOSIS — Z9483 Pancreas transplant status: Secondary | ICD-10-CM | POA: Diagnosis not present

## 2021-12-24 DIAGNOSIS — Z01818 Encounter for other preprocedural examination: Secondary | ICD-10-CM | POA: Diagnosis not present

## 2021-12-24 DIAGNOSIS — R748 Abnormal levels of other serum enzymes: Secondary | ICD-10-CM | POA: Diagnosis not present

## 2021-12-24 DIAGNOSIS — Z940 Kidney transplant status: Secondary | ICD-10-CM | POA: Diagnosis not present

## 2021-12-24 DIAGNOSIS — D849 Immunodeficiency, unspecified: Secondary | ICD-10-CM | POA: Diagnosis not present

## 2021-12-24 DIAGNOSIS — Z944 Liver transplant status: Secondary | ICD-10-CM | POA: Diagnosis not present

## 2021-12-24 DIAGNOSIS — E119 Type 2 diabetes mellitus without complications: Secondary | ICD-10-CM | POA: Diagnosis not present

## 2021-12-24 DIAGNOSIS — R0989 Other specified symptoms and signs involving the circulatory and respiratory systems: Secondary | ICD-10-CM | POA: Diagnosis not present

## 2021-12-24 DIAGNOSIS — J984 Other disorders of lung: Secondary | ICD-10-CM | POA: Diagnosis not present

## 2021-12-24 DIAGNOSIS — K767 Hepatorenal syndrome: Secondary | ICD-10-CM | POA: Diagnosis not present

## 2022-01-03 DIAGNOSIS — Z944 Liver transplant status: Secondary | ICD-10-CM | POA: Diagnosis not present

## 2022-01-03 DIAGNOSIS — Z4823 Encounter for aftercare following liver transplant: Secondary | ICD-10-CM | POA: Diagnosis not present

## 2022-01-03 DIAGNOSIS — D849 Immunodeficiency, unspecified: Secondary | ICD-10-CM | POA: Diagnosis not present

## 2022-01-03 DIAGNOSIS — Z949 Transplanted organ and tissue status, unspecified: Secondary | ICD-10-CM | POA: Diagnosis not present

## 2022-01-03 DIAGNOSIS — Z48298 Encounter for aftercare following other organ transplant: Secondary | ICD-10-CM | POA: Diagnosis not present

## 2022-01-06 DIAGNOSIS — D849 Immunodeficiency, unspecified: Secondary | ICD-10-CM | POA: Diagnosis not present

## 2022-01-06 DIAGNOSIS — Z944 Liver transplant status: Secondary | ICD-10-CM | POA: Diagnosis not present

## 2022-01-06 DIAGNOSIS — Z4823 Encounter for aftercare following liver transplant: Secondary | ICD-10-CM | POA: Diagnosis not present

## 2022-01-06 DIAGNOSIS — Z48298 Encounter for aftercare following other organ transplant: Secondary | ICD-10-CM | POA: Diagnosis not present

## 2022-01-09 DIAGNOSIS — Z48298 Encounter for aftercare following other organ transplant: Secondary | ICD-10-CM | POA: Diagnosis not present

## 2022-01-09 DIAGNOSIS — Z944 Liver transplant status: Secondary | ICD-10-CM | POA: Diagnosis not present

## 2022-01-09 DIAGNOSIS — Z4823 Encounter for aftercare following liver transplant: Secondary | ICD-10-CM | POA: Diagnosis not present

## 2022-01-13 DIAGNOSIS — Z4823 Encounter for aftercare following liver transplant: Secondary | ICD-10-CM | POA: Diagnosis not present

## 2022-01-13 DIAGNOSIS — D849 Immunodeficiency, unspecified: Secondary | ICD-10-CM | POA: Diagnosis not present

## 2022-01-13 DIAGNOSIS — Z944 Liver transplant status: Secondary | ICD-10-CM | POA: Diagnosis not present

## 2022-01-13 DIAGNOSIS — Z48298 Encounter for aftercare following other organ transplant: Secondary | ICD-10-CM | POA: Diagnosis not present

## 2022-01-14 DIAGNOSIS — K7460 Unspecified cirrhosis of liver: Secondary | ICD-10-CM | POA: Diagnosis not present

## 2022-01-14 DIAGNOSIS — R300 Dysuria: Secondary | ICD-10-CM | POA: Diagnosis not present

## 2022-01-14 DIAGNOSIS — Z794 Long term (current) use of insulin: Secondary | ICD-10-CM | POA: Diagnosis not present

## 2022-01-14 DIAGNOSIS — I10 Essential (primary) hypertension: Secondary | ICD-10-CM | POA: Diagnosis not present

## 2022-01-14 DIAGNOSIS — E1165 Type 2 diabetes mellitus with hyperglycemia: Secondary | ICD-10-CM | POA: Diagnosis not present

## 2022-01-17 DIAGNOSIS — Z87891 Personal history of nicotine dependence: Secondary | ICD-10-CM | POA: Diagnosis not present

## 2022-01-17 DIAGNOSIS — Z7982 Long term (current) use of aspirin: Secondary | ICD-10-CM | POA: Diagnosis not present

## 2022-01-17 DIAGNOSIS — Z944 Liver transplant status: Secondary | ICD-10-CM | POA: Diagnosis not present

## 2022-01-17 DIAGNOSIS — T8641 Liver transplant rejection: Secondary | ICD-10-CM | POA: Diagnosis not present

## 2022-01-17 DIAGNOSIS — Z794 Long term (current) use of insulin: Secondary | ICD-10-CM | POA: Diagnosis not present

## 2022-01-17 DIAGNOSIS — R748 Abnormal levels of other serum enzymes: Secondary | ICD-10-CM | POA: Diagnosis not present

## 2022-01-17 DIAGNOSIS — E119 Type 2 diabetes mellitus without complications: Secondary | ICD-10-CM | POA: Diagnosis not present

## 2022-01-17 DIAGNOSIS — D849 Immunodeficiency, unspecified: Secondary | ICD-10-CM | POA: Diagnosis not present

## 2022-01-17 DIAGNOSIS — Z7952 Long term (current) use of systemic steroids: Secondary | ICD-10-CM | POA: Diagnosis not present

## 2022-01-20 DIAGNOSIS — Z48298 Encounter for aftercare following other organ transplant: Secondary | ICD-10-CM | POA: Diagnosis not present

## 2022-01-20 DIAGNOSIS — Z944 Liver transplant status: Secondary | ICD-10-CM | POA: Diagnosis not present

## 2022-01-20 DIAGNOSIS — D849 Immunodeficiency, unspecified: Secondary | ICD-10-CM | POA: Diagnosis not present

## 2022-01-20 DIAGNOSIS — Z4823 Encounter for aftercare following liver transplant: Secondary | ICD-10-CM | POA: Diagnosis not present

## 2022-01-22 DIAGNOSIS — Z4823 Encounter for aftercare following liver transplant: Secondary | ICD-10-CM | POA: Diagnosis not present

## 2022-01-22 DIAGNOSIS — D849 Immunodeficiency, unspecified: Secondary | ICD-10-CM | POA: Diagnosis not present

## 2022-01-22 DIAGNOSIS — Z944 Liver transplant status: Secondary | ICD-10-CM | POA: Diagnosis not present

## 2022-01-22 DIAGNOSIS — Z48298 Encounter for aftercare following other organ transplant: Secondary | ICD-10-CM | POA: Diagnosis not present

## 2022-01-27 DIAGNOSIS — T8641 Liver transplant rejection: Secondary | ICD-10-CM | POA: Diagnosis not present

## 2022-01-27 DIAGNOSIS — Z4823 Encounter for aftercare following liver transplant: Secondary | ICD-10-CM | POA: Diagnosis not present

## 2022-01-27 DIAGNOSIS — Z48298 Encounter for aftercare following other organ transplant: Secondary | ICD-10-CM | POA: Diagnosis not present

## 2022-01-27 DIAGNOSIS — D849 Immunodeficiency, unspecified: Secondary | ICD-10-CM | POA: Diagnosis not present

## 2022-01-27 DIAGNOSIS — B259 Cytomegaloviral disease, unspecified: Secondary | ICD-10-CM | POA: Diagnosis not present

## 2022-01-27 DIAGNOSIS — Z944 Liver transplant status: Secondary | ICD-10-CM | POA: Diagnosis not present

## 2022-01-29 DIAGNOSIS — N401 Enlarged prostate with lower urinary tract symptoms: Secondary | ICD-10-CM | POA: Diagnosis not present

## 2022-01-29 DIAGNOSIS — N200 Calculus of kidney: Secondary | ICD-10-CM | POA: Diagnosis not present

## 2022-01-29 DIAGNOSIS — R3912 Poor urinary stream: Secondary | ICD-10-CM | POA: Diagnosis not present

## 2022-02-03 DIAGNOSIS — Z944 Liver transplant status: Secondary | ICD-10-CM | POA: Diagnosis not present

## 2022-02-03 DIAGNOSIS — D849 Immunodeficiency, unspecified: Secondary | ICD-10-CM | POA: Diagnosis not present

## 2022-02-03 DIAGNOSIS — Z4823 Encounter for aftercare following liver transplant: Secondary | ICD-10-CM | POA: Diagnosis not present

## 2022-02-03 DIAGNOSIS — Z48298 Encounter for aftercare following other organ transplant: Secondary | ICD-10-CM | POA: Diagnosis not present

## 2022-02-10 DIAGNOSIS — Z79621 Long term (current) use of calcineurin inhibitor: Secondary | ICD-10-CM | POA: Diagnosis not present

## 2022-02-10 DIAGNOSIS — Z48298 Encounter for aftercare following other organ transplant: Secondary | ICD-10-CM | POA: Diagnosis not present

## 2022-02-10 DIAGNOSIS — Z7982 Long term (current) use of aspirin: Secondary | ICD-10-CM | POA: Diagnosis not present

## 2022-02-10 DIAGNOSIS — Z4823 Encounter for aftercare following liver transplant: Secondary | ICD-10-CM | POA: Diagnosis not present

## 2022-02-10 DIAGNOSIS — D84821 Immunodeficiency due to drugs: Secondary | ICD-10-CM | POA: Diagnosis not present

## 2022-02-10 DIAGNOSIS — Z7952 Long term (current) use of systemic steroids: Secondary | ICD-10-CM | POA: Diagnosis not present

## 2022-02-10 DIAGNOSIS — Z944 Liver transplant status: Secondary | ICD-10-CM | POA: Diagnosis not present

## 2022-02-10 DIAGNOSIS — D849 Immunodeficiency, unspecified: Secondary | ICD-10-CM | POA: Diagnosis not present

## 2022-02-17 DIAGNOSIS — Z48298 Encounter for aftercare following other organ transplant: Secondary | ICD-10-CM | POA: Diagnosis not present

## 2022-02-17 DIAGNOSIS — Z944 Liver transplant status: Secondary | ICD-10-CM | POA: Diagnosis not present

## 2022-02-17 DIAGNOSIS — Z4823 Encounter for aftercare following liver transplant: Secondary | ICD-10-CM | POA: Diagnosis not present

## 2022-02-17 DIAGNOSIS — D849 Immunodeficiency, unspecified: Secondary | ICD-10-CM | POA: Diagnosis not present

## 2022-02-28 DIAGNOSIS — I2510 Atherosclerotic heart disease of native coronary artery without angina pectoris: Secondary | ICD-10-CM | POA: Diagnosis not present

## 2022-02-28 DIAGNOSIS — E669 Obesity, unspecified: Secondary | ICD-10-CM | POA: Diagnosis not present

## 2022-02-28 DIAGNOSIS — T8641 Liver transplant rejection: Secondary | ICD-10-CM | POA: Diagnosis not present

## 2022-02-28 DIAGNOSIS — I10 Essential (primary) hypertension: Secondary | ICD-10-CM | POA: Diagnosis not present

## 2022-02-28 DIAGNOSIS — D849 Immunodeficiency, unspecified: Secondary | ICD-10-CM | POA: Diagnosis not present

## 2022-02-28 DIAGNOSIS — Z944 Liver transplant status: Secondary | ICD-10-CM | POA: Diagnosis not present

## 2022-02-28 DIAGNOSIS — B259 Cytomegaloviral disease, unspecified: Secondary | ICD-10-CM | POA: Diagnosis not present

## 2022-02-28 DIAGNOSIS — C220 Liver cell carcinoma: Secondary | ICD-10-CM | POA: Diagnosis not present

## 2022-03-05 DIAGNOSIS — Z79899 Other long term (current) drug therapy: Secondary | ICD-10-CM | POA: Diagnosis not present

## 2022-03-05 DIAGNOSIS — D84821 Immunodeficiency due to drugs: Secondary | ICD-10-CM | POA: Diagnosis not present

## 2022-03-05 DIAGNOSIS — Z944 Liver transplant status: Secondary | ICD-10-CM | POA: Diagnosis not present

## 2022-03-12 DIAGNOSIS — Z79899 Other long term (current) drug therapy: Secondary | ICD-10-CM | POA: Diagnosis not present

## 2022-03-12 DIAGNOSIS — D84821 Immunodeficiency due to drugs: Secondary | ICD-10-CM | POA: Diagnosis not present

## 2022-03-12 DIAGNOSIS — Z944 Liver transplant status: Secondary | ICD-10-CM | POA: Diagnosis not present

## 2022-03-19 DIAGNOSIS — D84821 Immunodeficiency due to drugs: Secondary | ICD-10-CM | POA: Diagnosis not present

## 2022-03-19 DIAGNOSIS — Z944 Liver transplant status: Secondary | ICD-10-CM | POA: Diagnosis not present

## 2022-03-19 DIAGNOSIS — Z79899 Other long term (current) drug therapy: Secondary | ICD-10-CM | POA: Diagnosis not present

## 2022-03-24 DIAGNOSIS — Z949 Transplanted organ and tissue status, unspecified: Secondary | ICD-10-CM | POA: Diagnosis not present

## 2022-03-26 DIAGNOSIS — Z4823 Encounter for aftercare following liver transplant: Secondary | ICD-10-CM | POA: Diagnosis not present

## 2022-03-26 DIAGNOSIS — E669 Obesity, unspecified: Secondary | ICD-10-CM | POA: Diagnosis not present

## 2022-03-26 DIAGNOSIS — C220 Liver cell carcinoma: Secondary | ICD-10-CM | POA: Diagnosis not present

## 2022-03-26 DIAGNOSIS — D849 Immunodeficiency, unspecified: Secondary | ICD-10-CM | POA: Diagnosis not present

## 2022-03-26 DIAGNOSIS — Z48298 Encounter for aftercare following other organ transplant: Secondary | ICD-10-CM | POA: Diagnosis not present

## 2022-03-26 DIAGNOSIS — Z944 Liver transplant status: Secondary | ICD-10-CM | POA: Diagnosis not present

## 2022-03-31 DIAGNOSIS — Z944 Liver transplant status: Secondary | ICD-10-CM | POA: Diagnosis not present

## 2022-03-31 DIAGNOSIS — Z79899 Other long term (current) drug therapy: Secondary | ICD-10-CM | POA: Diagnosis not present

## 2022-03-31 DIAGNOSIS — Z4823 Encounter for aftercare following liver transplant: Secondary | ICD-10-CM | POA: Diagnosis not present

## 2022-03-31 DIAGNOSIS — D84821 Immunodeficiency due to drugs: Secondary | ICD-10-CM | POA: Diagnosis not present

## 2022-04-01 DIAGNOSIS — Z794 Long term (current) use of insulin: Secondary | ICD-10-CM | POA: Diagnosis not present

## 2022-04-01 DIAGNOSIS — M1990 Unspecified osteoarthritis, unspecified site: Secondary | ICD-10-CM | POA: Diagnosis not present

## 2022-04-01 DIAGNOSIS — N401 Enlarged prostate with lower urinary tract symptoms: Secondary | ICD-10-CM | POA: Diagnosis not present

## 2022-04-01 DIAGNOSIS — R3911 Hesitancy of micturition: Secondary | ICD-10-CM | POA: Diagnosis not present

## 2022-04-01 DIAGNOSIS — E039 Hypothyroidism, unspecified: Secondary | ICD-10-CM | POA: Diagnosis not present

## 2022-04-01 DIAGNOSIS — E1165 Type 2 diabetes mellitus with hyperglycemia: Secondary | ICD-10-CM | POA: Diagnosis not present

## 2022-04-01 DIAGNOSIS — I700 Atherosclerosis of aorta: Secondary | ICD-10-CM | POA: Diagnosis not present

## 2022-04-01 DIAGNOSIS — I10 Essential (primary) hypertension: Secondary | ICD-10-CM | POA: Diagnosis not present

## 2022-04-01 DIAGNOSIS — G4733 Obstructive sleep apnea (adult) (pediatric): Secondary | ICD-10-CM | POA: Diagnosis not present

## 2022-04-01 DIAGNOSIS — K7460 Unspecified cirrhosis of liver: Secondary | ICD-10-CM | POA: Diagnosis not present

## 2022-04-01 DIAGNOSIS — E785 Hyperlipidemia, unspecified: Secondary | ICD-10-CM | POA: Diagnosis not present

## 2022-04-04 DIAGNOSIS — D759 Disease of blood and blood-forming organs, unspecified: Secondary | ICD-10-CM | POA: Diagnosis not present

## 2022-04-04 DIAGNOSIS — D7289 Other specified disorders of white blood cells: Secondary | ICD-10-CM | POA: Diagnosis not present

## 2022-04-04 DIAGNOSIS — D696 Thrombocytopenia, unspecified: Secondary | ICD-10-CM | POA: Diagnosis not present

## 2022-04-04 DIAGNOSIS — R058 Other specified cough: Secondary | ICD-10-CM | POA: Diagnosis not present

## 2022-04-04 DIAGNOSIS — Z944 Liver transplant status: Secondary | ICD-10-CM | POA: Diagnosis not present

## 2022-04-04 DIAGNOSIS — J9809 Other diseases of bronchus, not elsewhere classified: Secondary | ICD-10-CM | POA: Diagnosis not present

## 2022-04-07 DIAGNOSIS — J209 Acute bronchitis, unspecified: Secondary | ICD-10-CM | POA: Diagnosis not present

## 2022-04-07 DIAGNOSIS — D84821 Immunodeficiency due to drugs: Secondary | ICD-10-CM | POA: Diagnosis not present

## 2022-04-07 DIAGNOSIS — J019 Acute sinusitis, unspecified: Secondary | ICD-10-CM | POA: Diagnosis not present

## 2022-04-07 DIAGNOSIS — Z79899 Other long term (current) drug therapy: Secondary | ICD-10-CM | POA: Diagnosis not present

## 2022-04-07 DIAGNOSIS — Z4823 Encounter for aftercare following liver transplant: Secondary | ICD-10-CM | POA: Diagnosis not present

## 2022-04-07 DIAGNOSIS — Z944 Liver transplant status: Secondary | ICD-10-CM | POA: Diagnosis not present

## 2022-04-11 DIAGNOSIS — Z944 Liver transplant status: Secondary | ICD-10-CM | POA: Diagnosis not present

## 2022-04-11 DIAGNOSIS — D696 Thrombocytopenia, unspecified: Secondary | ICD-10-CM | POA: Diagnosis not present

## 2022-04-11 DIAGNOSIS — E1165 Type 2 diabetes mellitus with hyperglycemia: Secondary | ICD-10-CM | POA: Diagnosis not present

## 2022-04-11 DIAGNOSIS — J40 Bronchitis, not specified as acute or chronic: Secondary | ICD-10-CM | POA: Diagnosis not present

## 2022-04-11 DIAGNOSIS — D7289 Other specified disorders of white blood cells: Secondary | ICD-10-CM | POA: Diagnosis not present

## 2022-04-14 DIAGNOSIS — Z79899 Other long term (current) drug therapy: Secondary | ICD-10-CM | POA: Diagnosis not present

## 2022-04-14 DIAGNOSIS — Z944 Liver transplant status: Secondary | ICD-10-CM | POA: Diagnosis not present

## 2022-04-14 DIAGNOSIS — Z4823 Encounter for aftercare following liver transplant: Secondary | ICD-10-CM | POA: Diagnosis not present

## 2022-04-14 DIAGNOSIS — D84821 Immunodeficiency due to drugs: Secondary | ICD-10-CM | POA: Diagnosis not present

## 2022-04-21 DIAGNOSIS — Z944 Liver transplant status: Secondary | ICD-10-CM | POA: Diagnosis not present

## 2022-04-21 DIAGNOSIS — Z79899 Other long term (current) drug therapy: Secondary | ICD-10-CM | POA: Diagnosis not present

## 2022-04-21 DIAGNOSIS — Z4823 Encounter for aftercare following liver transplant: Secondary | ICD-10-CM | POA: Diagnosis not present

## 2022-04-21 DIAGNOSIS — D84821 Immunodeficiency due to drugs: Secondary | ICD-10-CM | POA: Diagnosis not present

## 2022-04-24 ENCOUNTER — Ambulatory Visit: Payer: Medicare HMO | Admitting: Oncology

## 2022-04-24 ENCOUNTER — Other Ambulatory Visit: Payer: Medicare HMO

## 2022-04-28 DIAGNOSIS — Z79899 Other long term (current) drug therapy: Secondary | ICD-10-CM | POA: Diagnosis not present

## 2022-04-28 DIAGNOSIS — Z944 Liver transplant status: Secondary | ICD-10-CM | POA: Diagnosis not present

## 2022-04-28 DIAGNOSIS — Z4823 Encounter for aftercare following liver transplant: Secondary | ICD-10-CM | POA: Diagnosis not present

## 2022-04-28 DIAGNOSIS — D84821 Immunodeficiency due to drugs: Secondary | ICD-10-CM | POA: Diagnosis not present

## 2022-05-01 DIAGNOSIS — I10 Essential (primary) hypertension: Secondary | ICD-10-CM | POA: Diagnosis not present

## 2022-05-01 DIAGNOSIS — E1165 Type 2 diabetes mellitus with hyperglycemia: Secondary | ICD-10-CM | POA: Diagnosis not present

## 2022-05-01 DIAGNOSIS — E785 Hyperlipidemia, unspecified: Secondary | ICD-10-CM | POA: Diagnosis not present

## 2022-05-01 DIAGNOSIS — Z794 Long term (current) use of insulin: Secondary | ICD-10-CM | POA: Diagnosis not present

## 2022-05-05 DIAGNOSIS — Z4823 Encounter for aftercare following liver transplant: Secondary | ICD-10-CM | POA: Diagnosis not present

## 2022-05-05 DIAGNOSIS — Z79899 Other long term (current) drug therapy: Secondary | ICD-10-CM | POA: Diagnosis not present

## 2022-05-05 DIAGNOSIS — D84821 Immunodeficiency due to drugs: Secondary | ICD-10-CM | POA: Diagnosis not present

## 2022-05-05 DIAGNOSIS — Z944 Liver transplant status: Secondary | ICD-10-CM | POA: Diagnosis not present

## 2022-05-12 DIAGNOSIS — Z79899 Other long term (current) drug therapy: Secondary | ICD-10-CM | POA: Diagnosis not present

## 2022-05-12 DIAGNOSIS — Z4823 Encounter for aftercare following liver transplant: Secondary | ICD-10-CM | POA: Diagnosis not present

## 2022-05-12 DIAGNOSIS — Z944 Liver transplant status: Secondary | ICD-10-CM | POA: Diagnosis not present

## 2022-05-12 DIAGNOSIS — D84821 Immunodeficiency due to drugs: Secondary | ICD-10-CM | POA: Diagnosis not present

## 2022-05-16 DIAGNOSIS — D708 Other neutropenia: Secondary | ICD-10-CM | POA: Diagnosis not present

## 2022-05-16 DIAGNOSIS — Z944 Liver transplant status: Secondary | ICD-10-CM | POA: Diagnosis not present

## 2022-05-16 DIAGNOSIS — D696 Thrombocytopenia, unspecified: Secondary | ICD-10-CM | POA: Diagnosis not present

## 2022-05-16 DIAGNOSIS — D7289 Other specified disorders of white blood cells: Secondary | ICD-10-CM | POA: Diagnosis not present

## 2022-05-19 DIAGNOSIS — D84821 Immunodeficiency due to drugs: Secondary | ICD-10-CM | POA: Diagnosis not present

## 2022-05-19 DIAGNOSIS — Z944 Liver transplant status: Secondary | ICD-10-CM | POA: Diagnosis not present

## 2022-05-19 DIAGNOSIS — Z4823 Encounter for aftercare following liver transplant: Secondary | ICD-10-CM | POA: Diagnosis not present

## 2022-05-19 DIAGNOSIS — Z79899 Other long term (current) drug therapy: Secondary | ICD-10-CM | POA: Diagnosis not present

## 2022-05-27 DIAGNOSIS — Z944 Liver transplant status: Secondary | ICD-10-CM | POA: Diagnosis not present

## 2022-05-27 DIAGNOSIS — R7989 Other specified abnormal findings of blood chemistry: Secondary | ICD-10-CM | POA: Diagnosis not present

## 2022-05-27 DIAGNOSIS — I10 Essential (primary) hypertension: Secondary | ICD-10-CM | POA: Diagnosis not present

## 2022-05-27 DIAGNOSIS — K760 Fatty (change of) liver, not elsewhere classified: Secondary | ICD-10-CM | POA: Diagnosis not present

## 2022-05-27 DIAGNOSIS — Z87891 Personal history of nicotine dependence: Secondary | ICD-10-CM | POA: Diagnosis not present

## 2022-05-27 DIAGNOSIS — E119 Type 2 diabetes mellitus without complications: Secondary | ICD-10-CM | POA: Diagnosis not present

## 2022-06-01 DIAGNOSIS — I10 Essential (primary) hypertension: Secondary | ICD-10-CM | POA: Diagnosis not present

## 2022-06-01 DIAGNOSIS — Z794 Long term (current) use of insulin: Secondary | ICD-10-CM | POA: Diagnosis not present

## 2022-06-01 DIAGNOSIS — E785 Hyperlipidemia, unspecified: Secondary | ICD-10-CM | POA: Diagnosis not present

## 2022-06-01 DIAGNOSIS — E1165 Type 2 diabetes mellitus with hyperglycemia: Secondary | ICD-10-CM | POA: Diagnosis not present

## 2022-06-02 DIAGNOSIS — Z944 Liver transplant status: Secondary | ICD-10-CM | POA: Diagnosis not present

## 2022-06-02 DIAGNOSIS — Z4823 Encounter for aftercare following liver transplant: Secondary | ICD-10-CM | POA: Diagnosis not present

## 2022-06-02 DIAGNOSIS — D84821 Immunodeficiency due to drugs: Secondary | ICD-10-CM | POA: Diagnosis not present

## 2022-06-02 DIAGNOSIS — Z79899 Other long term (current) drug therapy: Secondary | ICD-10-CM | POA: Diagnosis not present

## 2022-06-09 DIAGNOSIS — Z79899 Other long term (current) drug therapy: Secondary | ICD-10-CM | POA: Diagnosis not present

## 2022-06-09 DIAGNOSIS — Z4823 Encounter for aftercare following liver transplant: Secondary | ICD-10-CM | POA: Diagnosis not present

## 2022-06-09 DIAGNOSIS — D84821 Immunodeficiency due to drugs: Secondary | ICD-10-CM | POA: Diagnosis not present

## 2022-06-09 DIAGNOSIS — Z8505 Personal history of malignant neoplasm of liver: Secondary | ICD-10-CM | POA: Diagnosis not present

## 2022-06-09 DIAGNOSIS — Z944 Liver transplant status: Secondary | ICD-10-CM | POA: Diagnosis not present

## 2022-06-10 DIAGNOSIS — I10 Essential (primary) hypertension: Secondary | ICD-10-CM | POA: Diagnosis not present

## 2022-06-10 DIAGNOSIS — J208 Acute bronchitis due to other specified organisms: Secondary | ICD-10-CM | POA: Diagnosis not present

## 2022-06-16 DIAGNOSIS — Z4823 Encounter for aftercare following liver transplant: Secondary | ICD-10-CM | POA: Diagnosis not present

## 2022-06-16 DIAGNOSIS — Z944 Liver transplant status: Secondary | ICD-10-CM | POA: Diagnosis not present

## 2022-06-16 DIAGNOSIS — Z79899 Other long term (current) drug therapy: Secondary | ICD-10-CM | POA: Diagnosis not present

## 2022-06-16 DIAGNOSIS — D84821 Immunodeficiency due to drugs: Secondary | ICD-10-CM | POA: Diagnosis not present

## 2022-06-17 DIAGNOSIS — I2510 Atherosclerotic heart disease of native coronary artery without angina pectoris: Secondary | ICD-10-CM | POA: Diagnosis not present

## 2022-06-17 DIAGNOSIS — D849 Immunodeficiency, unspecified: Secondary | ICD-10-CM | POA: Diagnosis not present

## 2022-06-17 DIAGNOSIS — C220 Liver cell carcinoma: Secondary | ICD-10-CM | POA: Diagnosis not present

## 2022-06-17 DIAGNOSIS — Z944 Liver transplant status: Secondary | ICD-10-CM | POA: Diagnosis not present

## 2022-06-17 DIAGNOSIS — E669 Obesity, unspecified: Secondary | ICD-10-CM | POA: Diagnosis not present

## 2022-06-24 DIAGNOSIS — C220 Liver cell carcinoma: Secondary | ICD-10-CM | POA: Diagnosis not present

## 2022-06-24 DIAGNOSIS — Z944 Liver transplant status: Secondary | ICD-10-CM | POA: Diagnosis not present

## 2022-06-26 DIAGNOSIS — I10 Essential (primary) hypertension: Secondary | ICD-10-CM | POA: Diagnosis not present

## 2022-06-26 DIAGNOSIS — E039 Hypothyroidism, unspecified: Secondary | ICD-10-CM | POA: Diagnosis not present

## 2022-06-26 DIAGNOSIS — G4733 Obstructive sleep apnea (adult) (pediatric): Secondary | ICD-10-CM | POA: Diagnosis not present

## 2022-06-26 DIAGNOSIS — Z794 Long term (current) use of insulin: Secondary | ICD-10-CM | POA: Diagnosis not present

## 2022-06-26 DIAGNOSIS — M1990 Unspecified osteoarthritis, unspecified site: Secondary | ICD-10-CM | POA: Diagnosis not present

## 2022-06-26 DIAGNOSIS — E1165 Type 2 diabetes mellitus with hyperglycemia: Secondary | ICD-10-CM | POA: Diagnosis not present

## 2022-06-26 DIAGNOSIS — Z9181 History of falling: Secondary | ICD-10-CM | POA: Diagnosis not present

## 2022-06-26 DIAGNOSIS — E785 Hyperlipidemia, unspecified: Secondary | ICD-10-CM | POA: Diagnosis not present

## 2022-06-26 DIAGNOSIS — Z139 Encounter for screening, unspecified: Secondary | ICD-10-CM | POA: Diagnosis not present

## 2022-06-30 DIAGNOSIS — Z79899 Other long term (current) drug therapy: Secondary | ICD-10-CM | POA: Diagnosis not present

## 2022-06-30 DIAGNOSIS — Z4823 Encounter for aftercare following liver transplant: Secondary | ICD-10-CM | POA: Diagnosis not present

## 2022-06-30 DIAGNOSIS — Z944 Liver transplant status: Secondary | ICD-10-CM | POA: Diagnosis not present

## 2022-06-30 DIAGNOSIS — D84821 Immunodeficiency due to drugs: Secondary | ICD-10-CM | POA: Diagnosis not present

## 2022-07-02 DIAGNOSIS — J019 Acute sinusitis, unspecified: Secondary | ICD-10-CM | POA: Diagnosis not present

## 2022-07-02 DIAGNOSIS — B001 Herpesviral vesicular dermatitis: Secondary | ICD-10-CM | POA: Diagnosis not present

## 2022-07-09 DIAGNOSIS — Z7982 Long term (current) use of aspirin: Secondary | ICD-10-CM | POA: Diagnosis not present

## 2022-07-09 DIAGNOSIS — K760 Fatty (change of) liver, not elsewhere classified: Secondary | ICD-10-CM | POA: Diagnosis not present

## 2022-07-09 DIAGNOSIS — E119 Type 2 diabetes mellitus without complications: Secondary | ICD-10-CM | POA: Diagnosis not present

## 2022-07-09 DIAGNOSIS — I10 Essential (primary) hypertension: Secondary | ICD-10-CM | POA: Diagnosis not present

## 2022-07-09 DIAGNOSIS — N179 Acute kidney failure, unspecified: Secondary | ICD-10-CM | POA: Diagnosis not present

## 2022-07-09 DIAGNOSIS — R599 Enlarged lymph nodes, unspecified: Secondary | ICD-10-CM | POA: Diagnosis not present

## 2022-07-09 DIAGNOSIS — Z87891 Personal history of nicotine dependence: Secondary | ICD-10-CM | POA: Diagnosis not present

## 2022-07-09 DIAGNOSIS — Z794 Long term (current) use of insulin: Secondary | ICD-10-CM | POA: Diagnosis not present

## 2022-07-09 DIAGNOSIS — R591 Generalized enlarged lymph nodes: Secondary | ICD-10-CM | POA: Diagnosis not present

## 2022-07-09 DIAGNOSIS — K862 Cyst of pancreas: Secondary | ICD-10-CM | POA: Diagnosis not present

## 2022-07-09 DIAGNOSIS — D696 Thrombocytopenia, unspecified: Secondary | ICD-10-CM | POA: Diagnosis not present

## 2022-07-09 DIAGNOSIS — Z79899 Other long term (current) drug therapy: Secondary | ICD-10-CM | POA: Diagnosis not present

## 2022-07-09 DIAGNOSIS — C228 Malignant neoplasm of liver, primary, unspecified as to type: Secondary | ICD-10-CM | POA: Diagnosis not present

## 2022-07-10 DIAGNOSIS — E1165 Type 2 diabetes mellitus with hyperglycemia: Secondary | ICD-10-CM | POA: Diagnosis not present

## 2022-07-14 DIAGNOSIS — Z4823 Encounter for aftercare following liver transplant: Secondary | ICD-10-CM | POA: Diagnosis not present

## 2022-07-14 DIAGNOSIS — Z944 Liver transplant status: Secondary | ICD-10-CM | POA: Diagnosis not present

## 2022-07-14 DIAGNOSIS — D84821 Immunodeficiency due to drugs: Secondary | ICD-10-CM | POA: Diagnosis not present

## 2022-07-14 DIAGNOSIS — Z79899 Other long term (current) drug therapy: Secondary | ICD-10-CM | POA: Diagnosis not present

## 2022-07-22 DIAGNOSIS — E1165 Type 2 diabetes mellitus with hyperglycemia: Secondary | ICD-10-CM | POA: Diagnosis not present

## 2022-07-22 DIAGNOSIS — F320 Major depressive disorder, single episode, mild: Secondary | ICD-10-CM | POA: Diagnosis not present

## 2022-07-22 DIAGNOSIS — I10 Essential (primary) hypertension: Secondary | ICD-10-CM | POA: Diagnosis not present

## 2022-07-22 DIAGNOSIS — Z794 Long term (current) use of insulin: Secondary | ICD-10-CM | POA: Diagnosis not present

## 2022-07-30 DIAGNOSIS — I10 Essential (primary) hypertension: Secondary | ICD-10-CM | POA: Diagnosis not present

## 2022-07-30 DIAGNOSIS — Z7982 Long term (current) use of aspirin: Secondary | ICD-10-CM | POA: Diagnosis not present

## 2022-07-30 DIAGNOSIS — D696 Thrombocytopenia, unspecified: Secondary | ICD-10-CM | POA: Diagnosis not present

## 2022-07-30 DIAGNOSIS — Z791 Long term (current) use of non-steroidal anti-inflammatories (NSAID): Secondary | ICD-10-CM | POA: Diagnosis not present

## 2022-07-30 DIAGNOSIS — E119 Type 2 diabetes mellitus without complications: Secondary | ICD-10-CM | POA: Diagnosis not present

## 2022-07-30 DIAGNOSIS — Z87891 Personal history of nicotine dependence: Secondary | ICD-10-CM | POA: Diagnosis not present

## 2022-07-30 DIAGNOSIS — K8689 Other specified diseases of pancreas: Secondary | ICD-10-CM | POA: Diagnosis not present

## 2022-07-30 DIAGNOSIS — C772 Secondary and unspecified malignant neoplasm of intra-abdominal lymph nodes: Secondary | ICD-10-CM | POA: Diagnosis not present

## 2022-07-30 DIAGNOSIS — R591 Generalized enlarged lymph nodes: Secondary | ICD-10-CM | POA: Diagnosis not present

## 2022-07-30 DIAGNOSIS — Z79899 Other long term (current) drug therapy: Secondary | ICD-10-CM | POA: Diagnosis not present

## 2022-07-30 DIAGNOSIS — N179 Acute kidney failure, unspecified: Secondary | ICD-10-CM | POA: Diagnosis not present

## 2022-07-30 DIAGNOSIS — Z79621 Long term (current) use of calcineurin inhibitor: Secondary | ICD-10-CM | POA: Diagnosis not present

## 2022-08-01 DIAGNOSIS — I10 Essential (primary) hypertension: Secondary | ICD-10-CM | POA: Diagnosis not present

## 2022-08-01 DIAGNOSIS — Z794 Long term (current) use of insulin: Secondary | ICD-10-CM | POA: Diagnosis not present

## 2022-08-01 DIAGNOSIS — E785 Hyperlipidemia, unspecified: Secondary | ICD-10-CM | POA: Diagnosis not present

## 2022-08-01 DIAGNOSIS — E1165 Type 2 diabetes mellitus with hyperglycemia: Secondary | ICD-10-CM | POA: Diagnosis not present

## 2022-08-10 DIAGNOSIS — Z794 Long term (current) use of insulin: Secondary | ICD-10-CM | POA: Diagnosis not present

## 2022-08-10 DIAGNOSIS — M47814 Spondylosis without myelopathy or radiculopathy, thoracic region: Secondary | ICD-10-CM | POA: Diagnosis not present

## 2022-08-10 DIAGNOSIS — R9431 Abnormal electrocardiogram [ECG] [EKG]: Secondary | ICD-10-CM | POA: Diagnosis not present

## 2022-08-10 DIAGNOSIS — E119 Type 2 diabetes mellitus without complications: Secondary | ICD-10-CM | POA: Diagnosis not present

## 2022-08-10 DIAGNOSIS — T8640 Unspecified complication of liver transplant: Secondary | ICD-10-CM | POA: Diagnosis not present

## 2022-08-10 DIAGNOSIS — I10 Essential (primary) hypertension: Secondary | ICD-10-CM | POA: Diagnosis not present

## 2022-08-10 DIAGNOSIS — T8643 Liver transplant infection: Secondary | ICD-10-CM | POA: Diagnosis not present

## 2022-08-10 DIAGNOSIS — Z79899 Other long term (current) drug therapy: Secondary | ICD-10-CM | POA: Diagnosis not present

## 2022-08-10 DIAGNOSIS — Z7982 Long term (current) use of aspirin: Secondary | ICD-10-CM | POA: Diagnosis not present

## 2022-08-10 DIAGNOSIS — Z1152 Encounter for screening for COVID-19: Secondary | ICD-10-CM | POA: Diagnosis not present

## 2022-08-10 DIAGNOSIS — N200 Calculus of kidney: Secondary | ICD-10-CM | POA: Diagnosis not present

## 2022-08-10 DIAGNOSIS — N289 Disorder of kidney and ureter, unspecified: Secondary | ICD-10-CM | POA: Diagnosis not present

## 2022-08-10 DIAGNOSIS — J9811 Atelectasis: Secondary | ICD-10-CM | POA: Diagnosis not present

## 2022-08-10 DIAGNOSIS — R918 Other nonspecific abnormal finding of lung field: Secondary | ICD-10-CM | POA: Diagnosis not present

## 2022-08-10 DIAGNOSIS — Y839 Surgical procedure, unspecified as the cause of abnormal reaction of the patient, or of later complication, without mention of misadventure at the time of the procedure: Secondary | ICD-10-CM | POA: Diagnosis not present

## 2022-08-12 DIAGNOSIS — K750 Abscess of liver: Secondary | ICD-10-CM | POA: Diagnosis not present

## 2022-08-12 DIAGNOSIS — E11649 Type 2 diabetes mellitus with hypoglycemia without coma: Secondary | ICD-10-CM | POA: Diagnosis not present

## 2022-08-12 DIAGNOSIS — K7689 Other specified diseases of liver: Secondary | ICD-10-CM | POA: Diagnosis not present

## 2022-08-12 DIAGNOSIS — R933 Abnormal findings on diagnostic imaging of other parts of digestive tract: Secondary | ICD-10-CM | POA: Diagnosis not present

## 2022-08-12 DIAGNOSIS — R1011 Right upper quadrant pain: Secondary | ICD-10-CM | POA: Diagnosis not present

## 2022-08-12 DIAGNOSIS — Z794 Long term (current) use of insulin: Secondary | ICD-10-CM | POA: Diagnosis not present

## 2022-08-12 DIAGNOSIS — A084 Viral intestinal infection, unspecified: Secondary | ICD-10-CM | POA: Diagnosis not present

## 2022-08-12 DIAGNOSIS — K2960 Other gastritis without bleeding: Secondary | ICD-10-CM | POA: Diagnosis not present

## 2022-08-12 DIAGNOSIS — Z515 Encounter for palliative care: Secondary | ICD-10-CM | POA: Diagnosis not present

## 2022-08-12 DIAGNOSIS — R509 Fever, unspecified: Secondary | ICD-10-CM | POA: Diagnosis not present

## 2022-08-12 DIAGNOSIS — R110 Nausea: Secondary | ICD-10-CM | POA: Diagnosis not present

## 2022-08-12 DIAGNOSIS — D696 Thrombocytopenia, unspecified: Secondary | ICD-10-CM | POA: Diagnosis not present

## 2022-08-12 DIAGNOSIS — E0800 Diabetes mellitus due to underlying condition with hyperosmolarity without nonketotic hyperglycemic-hyperosmolar coma (NKHHC): Secondary | ICD-10-CM | POA: Diagnosis not present

## 2022-08-12 DIAGNOSIS — I10 Essential (primary) hypertension: Secondary | ICD-10-CM | POA: Diagnosis not present

## 2022-08-12 DIAGNOSIS — D84821 Immunodeficiency due to drugs: Secondary | ICD-10-CM | POA: Diagnosis not present

## 2022-08-12 DIAGNOSIS — Z9889 Other specified postprocedural states: Secondary | ICD-10-CM | POA: Diagnosis not present

## 2022-08-12 DIAGNOSIS — B259 Cytomegaloviral disease, unspecified: Secondary | ICD-10-CM | POA: Diagnosis not present

## 2022-08-12 DIAGNOSIS — D123 Benign neoplasm of transverse colon: Secondary | ICD-10-CM | POA: Diagnosis not present

## 2022-08-12 DIAGNOSIS — E785 Hyperlipidemia, unspecified: Secondary | ICD-10-CM | POA: Diagnosis not present

## 2022-08-12 DIAGNOSIS — N200 Calculus of kidney: Secondary | ICD-10-CM | POA: Diagnosis not present

## 2022-08-12 DIAGNOSIS — C779 Secondary and unspecified malignant neoplasm of lymph node, unspecified: Secondary | ICD-10-CM | POA: Diagnosis not present

## 2022-08-12 DIAGNOSIS — R948 Abnormal results of function studies of other organs and systems: Secondary | ICD-10-CM | POA: Diagnosis not present

## 2022-08-12 DIAGNOSIS — K5903 Drug induced constipation: Secondary | ICD-10-CM | POA: Diagnosis not present

## 2022-08-12 DIAGNOSIS — Z4682 Encounter for fitting and adjustment of non-vascular catheter: Secondary | ICD-10-CM | POA: Diagnosis not present

## 2022-08-12 DIAGNOSIS — R918 Other nonspecific abnormal finding of lung field: Secondary | ICD-10-CM | POA: Diagnosis not present

## 2022-08-12 DIAGNOSIS — K651 Peritoneal abscess: Secondary | ICD-10-CM | POA: Diagnosis not present

## 2022-08-12 DIAGNOSIS — Z7189 Other specified counseling: Secondary | ICD-10-CM | POA: Diagnosis not present

## 2022-08-12 DIAGNOSIS — T8640 Unspecified complication of liver transplant: Secondary | ICD-10-CM | POA: Diagnosis not present

## 2022-08-12 DIAGNOSIS — R911 Solitary pulmonary nodule: Secondary | ICD-10-CM | POA: Diagnosis not present

## 2022-08-12 DIAGNOSIS — G4733 Obstructive sleep apnea (adult) (pediatric): Secondary | ICD-10-CM | POA: Diagnosis not present

## 2022-08-12 DIAGNOSIS — R109 Unspecified abdominal pain: Secondary | ICD-10-CM | POA: Diagnosis not present

## 2022-08-12 DIAGNOSIS — C220 Liver cell carcinoma: Secondary | ICD-10-CM | POA: Diagnosis not present

## 2022-08-12 DIAGNOSIS — D849 Immunodeficiency, unspecified: Secondary | ICD-10-CM | POA: Diagnosis not present

## 2022-08-12 DIAGNOSIS — R188 Other ascites: Secondary | ICD-10-CM | POA: Diagnosis not present

## 2022-08-12 DIAGNOSIS — Z944 Liver transplant status: Secondary | ICD-10-CM | POA: Diagnosis not present

## 2022-08-12 DIAGNOSIS — N179 Acute kidney failure, unspecified: Secondary | ICD-10-CM | POA: Diagnosis not present

## 2022-08-12 DIAGNOSIS — K659 Peritonitis, unspecified: Secondary | ICD-10-CM | POA: Diagnosis not present

## 2022-08-12 DIAGNOSIS — G893 Neoplasm related pain (acute) (chronic): Secondary | ICD-10-CM | POA: Diagnosis not present

## 2022-08-12 DIAGNOSIS — K7400 Hepatic fibrosis, unspecified: Secondary | ICD-10-CM | POA: Diagnosis not present

## 2022-08-12 DIAGNOSIS — K3189 Other diseases of stomach and duodenum: Secondary | ICD-10-CM | POA: Diagnosis not present

## 2022-08-12 DIAGNOSIS — C801 Malignant (primary) neoplasm, unspecified: Secondary | ICD-10-CM | POA: Diagnosis not present

## 2022-08-12 DIAGNOSIS — A0839 Other viral enteritis: Secondary | ICD-10-CM | POA: Diagnosis not present

## 2022-08-12 DIAGNOSIS — T8643 Liver transplant infection: Secondary | ICD-10-CM | POA: Diagnosis not present

## 2022-08-12 DIAGNOSIS — Z1152 Encounter for screening for COVID-19: Secondary | ICD-10-CM | POA: Diagnosis not present

## 2022-08-12 DIAGNOSIS — Z48815 Encounter for surgical aftercare following surgery on the digestive system: Secondary | ICD-10-CM | POA: Diagnosis not present

## 2022-08-12 DIAGNOSIS — R1013 Epigastric pain: Secondary | ICD-10-CM | POA: Diagnosis not present

## 2022-08-12 DIAGNOSIS — R079 Chest pain, unspecified: Secondary | ICD-10-CM | POA: Diagnosis not present

## 2022-08-12 DIAGNOSIS — Z79899 Other long term (current) drug therapy: Secondary | ICD-10-CM | POA: Diagnosis not present

## 2022-08-12 DIAGNOSIS — R9431 Abnormal electrocardiogram [ECG] [EKG]: Secondary | ICD-10-CM | POA: Diagnosis not present

## 2022-08-12 DIAGNOSIS — G9389 Other specified disorders of brain: Secondary | ICD-10-CM | POA: Diagnosis not present

## 2022-08-12 DIAGNOSIS — R161 Splenomegaly, not elsewhere classified: Secondary | ICD-10-CM | POA: Diagnosis not present

## 2022-08-12 DIAGNOSIS — Z7982 Long term (current) use of aspirin: Secondary | ICD-10-CM | POA: Diagnosis not present

## 2022-08-12 DIAGNOSIS — E119 Type 2 diabetes mellitus without complications: Secondary | ICD-10-CM | POA: Diagnosis not present

## 2022-08-12 DIAGNOSIS — L0291 Cutaneous abscess, unspecified: Secondary | ICD-10-CM | POA: Diagnosis not present

## 2022-08-26 DIAGNOSIS — K219 Gastro-esophageal reflux disease without esophagitis: Secondary | ICD-10-CM | POA: Diagnosis not present

## 2022-08-26 DIAGNOSIS — J8410 Pulmonary fibrosis, unspecified: Secondary | ICD-10-CM | POA: Diagnosis not present

## 2022-08-26 DIAGNOSIS — I10 Essential (primary) hypertension: Secondary | ICD-10-CM | POA: Diagnosis not present

## 2022-08-26 DIAGNOSIS — B259 Cytomegaloviral disease, unspecified: Secondary | ICD-10-CM | POA: Diagnosis not present

## 2022-08-26 DIAGNOSIS — K2970 Gastritis, unspecified, without bleeding: Secondary | ICD-10-CM | POA: Diagnosis not present

## 2022-08-26 DIAGNOSIS — I2510 Atherosclerotic heart disease of native coronary artery without angina pectoris: Secondary | ICD-10-CM | POA: Diagnosis not present

## 2022-08-26 DIAGNOSIS — E119 Type 2 diabetes mellitus without complications: Secondary | ICD-10-CM | POA: Diagnosis not present

## 2022-08-26 DIAGNOSIS — D696 Thrombocytopenia, unspecified: Secondary | ICD-10-CM | POA: Diagnosis not present

## 2022-08-26 DIAGNOSIS — G9389 Other specified disorders of brain: Secondary | ICD-10-CM | POA: Diagnosis not present

## 2022-08-29 DIAGNOSIS — B259 Cytomegaloviral disease, unspecified: Secondary | ICD-10-CM | POA: Diagnosis not present

## 2022-08-29 DIAGNOSIS — D696 Thrombocytopenia, unspecified: Secondary | ICD-10-CM | POA: Diagnosis not present

## 2022-08-29 DIAGNOSIS — E119 Type 2 diabetes mellitus without complications: Secondary | ICD-10-CM | POA: Diagnosis not present

## 2022-08-29 DIAGNOSIS — G9389 Other specified disorders of brain: Secondary | ICD-10-CM | POA: Diagnosis not present

## 2022-08-29 DIAGNOSIS — I10 Essential (primary) hypertension: Secondary | ICD-10-CM | POA: Diagnosis not present

## 2022-08-29 DIAGNOSIS — I2510 Atherosclerotic heart disease of native coronary artery without angina pectoris: Secondary | ICD-10-CM | POA: Diagnosis not present

## 2022-08-29 DIAGNOSIS — J8410 Pulmonary fibrosis, unspecified: Secondary | ICD-10-CM | POA: Diagnosis not present

## 2022-08-29 DIAGNOSIS — K2970 Gastritis, unspecified, without bleeding: Secondary | ICD-10-CM | POA: Diagnosis not present

## 2022-08-29 DIAGNOSIS — K219 Gastro-esophageal reflux disease without esophagitis: Secondary | ICD-10-CM | POA: Diagnosis not present

## 2022-09-01 DIAGNOSIS — Z79899 Other long term (current) drug therapy: Secondary | ICD-10-CM | POA: Diagnosis not present

## 2022-09-01 DIAGNOSIS — Z4823 Encounter for aftercare following liver transplant: Secondary | ICD-10-CM | POA: Diagnosis not present

## 2022-09-01 DIAGNOSIS — C801 Malignant (primary) neoplasm, unspecified: Secondary | ICD-10-CM | POA: Diagnosis not present

## 2022-09-01 DIAGNOSIS — D84821 Immunodeficiency due to drugs: Secondary | ICD-10-CM | POA: Diagnosis not present

## 2022-09-01 DIAGNOSIS — Z944 Liver transplant status: Secondary | ICD-10-CM | POA: Diagnosis not present

## 2022-09-02 DIAGNOSIS — B259 Cytomegaloviral disease, unspecified: Secondary | ICD-10-CM | POA: Diagnosis not present

## 2022-09-02 DIAGNOSIS — K219 Gastro-esophageal reflux disease without esophagitis: Secondary | ICD-10-CM | POA: Diagnosis not present

## 2022-09-02 DIAGNOSIS — I10 Essential (primary) hypertension: Secondary | ICD-10-CM | POA: Diagnosis not present

## 2022-09-02 DIAGNOSIS — D696 Thrombocytopenia, unspecified: Secondary | ICD-10-CM | POA: Diagnosis not present

## 2022-09-02 DIAGNOSIS — K2970 Gastritis, unspecified, without bleeding: Secondary | ICD-10-CM | POA: Diagnosis not present

## 2022-09-02 DIAGNOSIS — E119 Type 2 diabetes mellitus without complications: Secondary | ICD-10-CM | POA: Diagnosis not present

## 2022-09-02 DIAGNOSIS — G9389 Other specified disorders of brain: Secondary | ICD-10-CM | POA: Diagnosis not present

## 2022-09-02 DIAGNOSIS — I2510 Atherosclerotic heart disease of native coronary artery without angina pectoris: Secondary | ICD-10-CM | POA: Diagnosis not present

## 2022-09-02 DIAGNOSIS — J8410 Pulmonary fibrosis, unspecified: Secondary | ICD-10-CM | POA: Diagnosis not present

## 2022-09-08 DIAGNOSIS — Z79899 Other long term (current) drug therapy: Secondary | ICD-10-CM | POA: Diagnosis not present

## 2022-09-08 DIAGNOSIS — Z944 Liver transplant status: Secondary | ICD-10-CM | POA: Diagnosis not present

## 2022-09-08 DIAGNOSIS — D84821 Immunodeficiency due to drugs: Secondary | ICD-10-CM | POA: Diagnosis not present

## 2022-09-08 DIAGNOSIS — Z4823 Encounter for aftercare following liver transplant: Secondary | ICD-10-CM | POA: Diagnosis not present

## 2022-09-09 DIAGNOSIS — B259 Cytomegaloviral disease, unspecified: Secondary | ICD-10-CM | POA: Diagnosis not present

## 2022-09-09 DIAGNOSIS — K219 Gastro-esophageal reflux disease without esophagitis: Secondary | ICD-10-CM | POA: Diagnosis not present

## 2022-09-09 DIAGNOSIS — G9389 Other specified disorders of brain: Secondary | ICD-10-CM | POA: Diagnosis not present

## 2022-09-09 DIAGNOSIS — J8410 Pulmonary fibrosis, unspecified: Secondary | ICD-10-CM | POA: Diagnosis not present

## 2022-09-09 DIAGNOSIS — D696 Thrombocytopenia, unspecified: Secondary | ICD-10-CM | POA: Diagnosis not present

## 2022-09-09 DIAGNOSIS — I10 Essential (primary) hypertension: Secondary | ICD-10-CM | POA: Diagnosis not present

## 2022-09-09 DIAGNOSIS — E119 Type 2 diabetes mellitus without complications: Secondary | ICD-10-CM | POA: Diagnosis not present

## 2022-09-09 DIAGNOSIS — K2970 Gastritis, unspecified, without bleeding: Secondary | ICD-10-CM | POA: Diagnosis not present

## 2022-09-09 DIAGNOSIS — I2510 Atherosclerotic heart disease of native coronary artery without angina pectoris: Secondary | ICD-10-CM | POA: Diagnosis not present

## 2022-09-11 DIAGNOSIS — J690 Pneumonitis due to inhalation of food and vomit: Secondary | ICD-10-CM | POA: Diagnosis not present

## 2022-09-11 DIAGNOSIS — R1031 Right lower quadrant pain: Secondary | ICD-10-CM | POA: Diagnosis not present

## 2022-09-11 DIAGNOSIS — T8143XA Infection following a procedure, organ and space surgical site, initial encounter: Secondary | ICD-10-CM | POA: Diagnosis not present

## 2022-09-11 DIAGNOSIS — R1084 Generalized abdominal pain: Secondary | ICD-10-CM | POA: Diagnosis not present

## 2022-09-11 DIAGNOSIS — I3139 Other pericardial effusion (noninflammatory): Secondary | ICD-10-CM | POA: Diagnosis not present

## 2022-09-11 DIAGNOSIS — G9341 Metabolic encephalopathy: Secondary | ICD-10-CM | POA: Diagnosis not present

## 2022-09-11 DIAGNOSIS — J9602 Acute respiratory failure with hypercapnia: Secondary | ICD-10-CM | POA: Diagnosis not present

## 2022-09-11 DIAGNOSIS — Z944 Liver transplant status: Secondary | ICD-10-CM | POA: Diagnosis not present

## 2022-09-11 DIAGNOSIS — Z7682 Awaiting organ transplant status: Secondary | ICD-10-CM | POA: Diagnosis not present

## 2022-09-11 DIAGNOSIS — B49 Unspecified mycosis: Secondary | ICD-10-CM | POA: Diagnosis not present

## 2022-09-11 DIAGNOSIS — J90 Pleural effusion, not elsewhere classified: Secondary | ICD-10-CM | POA: Diagnosis not present

## 2022-09-11 DIAGNOSIS — E46 Unspecified protein-calorie malnutrition: Secondary | ICD-10-CM | POA: Diagnosis not present

## 2022-09-11 DIAGNOSIS — Z515 Encounter for palliative care: Secondary | ICD-10-CM | POA: Diagnosis not present

## 2022-09-11 DIAGNOSIS — J9691 Respiratory failure, unspecified with hypoxia: Secondary | ICD-10-CM | POA: Diagnosis not present

## 2022-09-11 DIAGNOSIS — I10 Essential (primary) hypertension: Secondary | ICD-10-CM | POA: Diagnosis not present

## 2022-09-11 DIAGNOSIS — K3189 Other diseases of stomach and duodenum: Secondary | ICD-10-CM | POA: Diagnosis not present

## 2022-09-11 DIAGNOSIS — D61818 Other pancytopenia: Secondary | ICD-10-CM | POA: Diagnosis not present

## 2022-09-11 DIAGNOSIS — B259 Cytomegaloviral disease, unspecified: Secondary | ICD-10-CM | POA: Diagnosis not present

## 2022-09-11 DIAGNOSIS — R110 Nausea: Secondary | ICD-10-CM | POA: Diagnosis not present

## 2022-09-11 DIAGNOSIS — Z9889 Other specified postprocedural states: Secondary | ICD-10-CM | POA: Diagnosis not present

## 2022-09-11 DIAGNOSIS — R4182 Altered mental status, unspecified: Secondary | ICD-10-CM | POA: Diagnosis not present

## 2022-09-11 DIAGNOSIS — L0291 Cutaneous abscess, unspecified: Secondary | ICD-10-CM | POA: Diagnosis not present

## 2022-09-11 DIAGNOSIS — N400 Enlarged prostate without lower urinary tract symptoms: Secondary | ICD-10-CM | POA: Diagnosis not present

## 2022-09-11 DIAGNOSIS — G9389 Other specified disorders of brain: Secondary | ICD-10-CM | POA: Diagnosis not present

## 2022-09-11 DIAGNOSIS — Z4682 Encounter for fitting and adjustment of non-vascular catheter: Secondary | ICD-10-CM | POA: Diagnosis not present

## 2022-09-11 DIAGNOSIS — E43 Unspecified severe protein-calorie malnutrition: Secondary | ICD-10-CM | POA: Diagnosis not present

## 2022-09-11 DIAGNOSIS — N179 Acute kidney failure, unspecified: Secondary | ICD-10-CM | POA: Diagnosis not present

## 2022-09-11 DIAGNOSIS — C220 Liver cell carcinoma: Secondary | ICD-10-CM | POA: Diagnosis not present

## 2022-09-11 DIAGNOSIS — R918 Other nonspecific abnormal finding of lung field: Secondary | ICD-10-CM | POA: Diagnosis not present

## 2022-09-11 DIAGNOSIS — J8410 Pulmonary fibrosis, unspecified: Secondary | ICD-10-CM | POA: Diagnosis not present

## 2022-09-11 DIAGNOSIS — R1900 Intra-abdominal and pelvic swelling, mass and lump, unspecified site: Secondary | ICD-10-CM | POA: Diagnosis not present

## 2022-09-11 DIAGNOSIS — K2970 Gastritis, unspecified, without bleeding: Secondary | ICD-10-CM | POA: Diagnosis not present

## 2022-09-11 DIAGNOSIS — Z4803 Encounter for change or removal of drains: Secondary | ICD-10-CM | POA: Diagnosis not present

## 2022-09-11 DIAGNOSIS — J9601 Acute respiratory failure with hypoxia: Secondary | ICD-10-CM | POA: Diagnosis not present

## 2022-09-11 DIAGNOSIS — L02211 Cutaneous abscess of abdominal wall: Secondary | ICD-10-CM | POA: Diagnosis not present

## 2022-09-11 DIAGNOSIS — D696 Thrombocytopenia, unspecified: Secondary | ICD-10-CM | POA: Diagnosis not present

## 2022-09-11 DIAGNOSIS — B998 Other infectious disease: Secondary | ICD-10-CM | POA: Diagnosis not present

## 2022-09-11 DIAGNOSIS — R109 Unspecified abdominal pain: Secondary | ICD-10-CM | POA: Diagnosis not present

## 2022-09-11 DIAGNOSIS — E119 Type 2 diabetes mellitus without complications: Secondary | ICD-10-CM | POA: Diagnosis not present

## 2022-09-11 DIAGNOSIS — R531 Weakness: Secondary | ICD-10-CM | POA: Diagnosis not present

## 2022-09-11 DIAGNOSIS — K750 Abscess of liver: Secondary | ICD-10-CM | POA: Diagnosis not present

## 2022-09-11 DIAGNOSIS — D708 Other neutropenia: Secondary | ICD-10-CM | POA: Diagnosis not present

## 2022-09-11 DIAGNOSIS — J181 Lobar pneumonia, unspecified organism: Secondary | ICD-10-CM | POA: Diagnosis not present

## 2022-09-11 DIAGNOSIS — I2510 Atherosclerotic heart disease of native coronary artery without angina pectoris: Secondary | ICD-10-CM | POA: Diagnosis not present

## 2022-09-11 DIAGNOSIS — K7689 Other specified diseases of liver: Secondary | ICD-10-CM | POA: Diagnosis not present

## 2022-09-11 DIAGNOSIS — G893 Neoplasm related pain (acute) (chronic): Secondary | ICD-10-CM | POA: Diagnosis not present

## 2022-09-11 DIAGNOSIS — J9610 Chronic respiratory failure, unspecified whether with hypoxia or hypercapnia: Secondary | ICD-10-CM | POA: Diagnosis not present

## 2022-09-11 DIAGNOSIS — K6389 Other specified diseases of intestine: Secondary | ICD-10-CM | POA: Diagnosis not present

## 2022-09-11 DIAGNOSIS — R410 Disorientation, unspecified: Secondary | ICD-10-CM | POA: Diagnosis not present

## 2022-09-11 DIAGNOSIS — R911 Solitary pulmonary nodule: Secondary | ICD-10-CM | POA: Diagnosis not present

## 2022-09-11 DIAGNOSIS — D849 Immunodeficiency, unspecified: Secondary | ICD-10-CM | POA: Diagnosis not present

## 2022-09-11 DIAGNOSIS — R112 Nausea with vomiting, unspecified: Secondary | ICD-10-CM | POA: Diagnosis not present

## 2022-09-11 DIAGNOSIS — K651 Peritoneal abscess: Secondary | ICD-10-CM | POA: Diagnosis not present

## 2022-09-11 DIAGNOSIS — E785 Hyperlipidemia, unspecified: Secondary | ICD-10-CM | POA: Diagnosis not present

## 2022-09-11 DIAGNOSIS — Z7189 Other specified counseling: Secondary | ICD-10-CM | POA: Diagnosis not present

## 2022-09-11 DIAGNOSIS — J9811 Atelectasis: Secondary | ICD-10-CM | POA: Diagnosis not present

## 2022-09-11 DIAGNOSIS — D84821 Immunodeficiency due to drugs: Secondary | ICD-10-CM | POA: Diagnosis not present

## 2022-09-11 DIAGNOSIS — Z79899 Other long term (current) drug therapy: Secondary | ICD-10-CM | POA: Diagnosis not present

## 2022-09-11 DIAGNOSIS — B459 Cryptococcosis, unspecified: Secondary | ICD-10-CM | POA: Diagnosis not present

## 2022-09-11 DIAGNOSIS — B258 Other cytomegaloviral diseases: Secondary | ICD-10-CM | POA: Diagnosis not present

## 2022-09-11 DIAGNOSIS — R161 Splenomegaly, not elsewhere classified: Secondary | ICD-10-CM | POA: Diagnosis not present

## 2022-09-11 DIAGNOSIS — K219 Gastro-esophageal reflux disease without esophagitis: Secondary | ICD-10-CM | POA: Diagnosis not present

## 2022-10-07 DIAGNOSIS — I10 Essential (primary) hypertension: Secondary | ICD-10-CM | POA: Diagnosis not present

## 2022-10-07 DIAGNOSIS — G9389 Other specified disorders of brain: Secondary | ICD-10-CM | POA: Diagnosis not present

## 2022-10-07 DIAGNOSIS — J8410 Pulmonary fibrosis, unspecified: Secondary | ICD-10-CM | POA: Diagnosis not present

## 2022-10-07 DIAGNOSIS — K2970 Gastritis, unspecified, without bleeding: Secondary | ICD-10-CM | POA: Diagnosis not present

## 2022-10-07 DIAGNOSIS — E119 Type 2 diabetes mellitus without complications: Secondary | ICD-10-CM | POA: Diagnosis not present

## 2022-10-07 DIAGNOSIS — B259 Cytomegaloviral disease, unspecified: Secondary | ICD-10-CM | POA: Diagnosis not present

## 2022-10-07 DIAGNOSIS — I2510 Atherosclerotic heart disease of native coronary artery without angina pectoris: Secondary | ICD-10-CM | POA: Diagnosis not present

## 2022-10-07 DIAGNOSIS — D696 Thrombocytopenia, unspecified: Secondary | ICD-10-CM | POA: Diagnosis not present

## 2022-10-07 DIAGNOSIS — K219 Gastro-esophageal reflux disease without esophagitis: Secondary | ICD-10-CM | POA: Diagnosis not present

## 2022-10-08 DIAGNOSIS — Z944 Liver transplant status: Secondary | ICD-10-CM | POA: Diagnosis not present

## 2022-10-08 DIAGNOSIS — Z79899 Other long term (current) drug therapy: Secondary | ICD-10-CM | POA: Diagnosis not present

## 2022-10-08 DIAGNOSIS — Z4823 Encounter for aftercare following liver transplant: Secondary | ICD-10-CM | POA: Diagnosis not present

## 2022-10-08 DIAGNOSIS — D84821 Immunodeficiency due to drugs: Secondary | ICD-10-CM | POA: Diagnosis not present

## 2022-10-14 DIAGNOSIS — Z944 Liver transplant status: Secondary | ICD-10-CM | POA: Diagnosis not present

## 2022-10-14 DIAGNOSIS — Z79899 Other long term (current) drug therapy: Secondary | ICD-10-CM | POA: Diagnosis not present

## 2022-10-14 DIAGNOSIS — D84821 Immunodeficiency due to drugs: Secondary | ICD-10-CM | POA: Diagnosis not present

## 2022-10-14 DIAGNOSIS — A084 Viral intestinal infection, unspecified: Secondary | ICD-10-CM | POA: Diagnosis not present

## 2022-10-14 DIAGNOSIS — B259 Cytomegaloviral disease, unspecified: Secondary | ICD-10-CM | POA: Diagnosis not present

## 2022-10-14 DIAGNOSIS — D702 Other drug-induced agranulocytosis: Secondary | ICD-10-CM | POA: Diagnosis not present

## 2022-10-14 DIAGNOSIS — D849 Immunodeficiency, unspecified: Secondary | ICD-10-CM | POA: Diagnosis not present

## 2022-10-14 DIAGNOSIS — M6284 Sarcopenia: Secondary | ICD-10-CM | POA: Diagnosis not present

## 2022-10-14 DIAGNOSIS — B459 Cryptococcosis, unspecified: Secondary | ICD-10-CM | POA: Diagnosis not present

## 2022-10-14 DIAGNOSIS — Z4823 Encounter for aftercare following liver transplant: Secondary | ICD-10-CM | POA: Diagnosis not present

## 2022-10-15 DIAGNOSIS — J8410 Pulmonary fibrosis, unspecified: Secondary | ICD-10-CM | POA: Diagnosis not present

## 2022-10-15 DIAGNOSIS — K2970 Gastritis, unspecified, without bleeding: Secondary | ICD-10-CM | POA: Diagnosis not present

## 2022-10-15 DIAGNOSIS — B259 Cytomegaloviral disease, unspecified: Secondary | ICD-10-CM | POA: Diagnosis not present

## 2022-10-15 DIAGNOSIS — E119 Type 2 diabetes mellitus without complications: Secondary | ICD-10-CM | POA: Diagnosis not present

## 2022-10-15 DIAGNOSIS — G9389 Other specified disorders of brain: Secondary | ICD-10-CM | POA: Diagnosis not present

## 2022-10-15 DIAGNOSIS — I2510 Atherosclerotic heart disease of native coronary artery without angina pectoris: Secondary | ICD-10-CM | POA: Diagnosis not present

## 2022-10-15 DIAGNOSIS — K219 Gastro-esophageal reflux disease without esophagitis: Secondary | ICD-10-CM | POA: Diagnosis not present

## 2022-10-15 DIAGNOSIS — D696 Thrombocytopenia, unspecified: Secondary | ICD-10-CM | POA: Diagnosis not present

## 2022-10-15 DIAGNOSIS — I10 Essential (primary) hypertension: Secondary | ICD-10-CM | POA: Diagnosis not present

## 2022-10-16 DIAGNOSIS — Z8505 Personal history of malignant neoplasm of liver: Secondary | ICD-10-CM | POA: Diagnosis not present

## 2022-10-16 DIAGNOSIS — R918 Other nonspecific abnormal finding of lung field: Secondary | ICD-10-CM | POA: Diagnosis not present

## 2022-10-16 DIAGNOSIS — J90 Pleural effusion, not elsewhere classified: Secondary | ICD-10-CM | POA: Diagnosis not present

## 2022-10-16 DIAGNOSIS — Z944 Liver transplant status: Secondary | ICD-10-CM | POA: Diagnosis not present

## 2022-10-16 DIAGNOSIS — R911 Solitary pulmonary nodule: Secondary | ICD-10-CM | POA: Diagnosis not present

## 2022-10-17 DIAGNOSIS — I10 Essential (primary) hypertension: Secondary | ICD-10-CM | POA: Diagnosis not present

## 2022-10-17 DIAGNOSIS — K219 Gastro-esophageal reflux disease without esophagitis: Secondary | ICD-10-CM | POA: Diagnosis not present

## 2022-10-17 DIAGNOSIS — K2970 Gastritis, unspecified, without bleeding: Secondary | ICD-10-CM | POA: Diagnosis not present

## 2022-10-17 DIAGNOSIS — B259 Cytomegaloviral disease, unspecified: Secondary | ICD-10-CM | POA: Diagnosis not present

## 2022-10-17 DIAGNOSIS — E119 Type 2 diabetes mellitus without complications: Secondary | ICD-10-CM | POA: Diagnosis not present

## 2022-10-17 DIAGNOSIS — J8410 Pulmonary fibrosis, unspecified: Secondary | ICD-10-CM | POA: Diagnosis not present

## 2022-10-17 DIAGNOSIS — I2510 Atherosclerotic heart disease of native coronary artery without angina pectoris: Secondary | ICD-10-CM | POA: Diagnosis not present

## 2022-10-17 DIAGNOSIS — G9389 Other specified disorders of brain: Secondary | ICD-10-CM | POA: Diagnosis not present

## 2022-10-17 DIAGNOSIS — D696 Thrombocytopenia, unspecified: Secondary | ICD-10-CM | POA: Diagnosis not present

## 2022-10-20 DIAGNOSIS — I10 Essential (primary) hypertension: Secondary | ICD-10-CM | POA: Diagnosis not present

## 2022-10-20 DIAGNOSIS — I2510 Atherosclerotic heart disease of native coronary artery without angina pectoris: Secondary | ICD-10-CM | POA: Diagnosis not present

## 2022-10-20 DIAGNOSIS — B259 Cytomegaloviral disease, unspecified: Secondary | ICD-10-CM | POA: Diagnosis not present

## 2022-10-20 DIAGNOSIS — D696 Thrombocytopenia, unspecified: Secondary | ICD-10-CM | POA: Diagnosis not present

## 2022-10-20 DIAGNOSIS — Z944 Liver transplant status: Secondary | ICD-10-CM | POA: Diagnosis not present

## 2022-10-20 DIAGNOSIS — G9389 Other specified disorders of brain: Secondary | ICD-10-CM | POA: Diagnosis not present

## 2022-10-20 DIAGNOSIS — J8410 Pulmonary fibrosis, unspecified: Secondary | ICD-10-CM | POA: Diagnosis not present

## 2022-10-20 DIAGNOSIS — Z4823 Encounter for aftercare following liver transplant: Secondary | ICD-10-CM | POA: Diagnosis not present

## 2022-10-20 DIAGNOSIS — D84821 Immunodeficiency due to drugs: Secondary | ICD-10-CM | POA: Diagnosis not present

## 2022-10-20 DIAGNOSIS — K219 Gastro-esophageal reflux disease without esophagitis: Secondary | ICD-10-CM | POA: Diagnosis not present

## 2022-10-20 DIAGNOSIS — Z79899 Other long term (current) drug therapy: Secondary | ICD-10-CM | POA: Diagnosis not present

## 2022-10-20 DIAGNOSIS — E119 Type 2 diabetes mellitus without complications: Secondary | ICD-10-CM | POA: Diagnosis not present

## 2022-10-20 DIAGNOSIS — K2970 Gastritis, unspecified, without bleeding: Secondary | ICD-10-CM | POA: Diagnosis not present

## 2022-10-21 DIAGNOSIS — K219 Gastro-esophageal reflux disease without esophagitis: Secondary | ICD-10-CM | POA: Diagnosis not present

## 2022-10-21 DIAGNOSIS — I2510 Atherosclerotic heart disease of native coronary artery without angina pectoris: Secondary | ICD-10-CM | POA: Diagnosis not present

## 2022-10-21 DIAGNOSIS — K2970 Gastritis, unspecified, without bleeding: Secondary | ICD-10-CM | POA: Diagnosis not present

## 2022-10-21 DIAGNOSIS — G9389 Other specified disorders of brain: Secondary | ICD-10-CM | POA: Diagnosis not present

## 2022-10-21 DIAGNOSIS — B259 Cytomegaloviral disease, unspecified: Secondary | ICD-10-CM | POA: Diagnosis not present

## 2022-10-21 DIAGNOSIS — I10 Essential (primary) hypertension: Secondary | ICD-10-CM | POA: Diagnosis not present

## 2022-10-21 DIAGNOSIS — E119 Type 2 diabetes mellitus without complications: Secondary | ICD-10-CM | POA: Diagnosis not present

## 2022-10-21 DIAGNOSIS — J8410 Pulmonary fibrosis, unspecified: Secondary | ICD-10-CM | POA: Diagnosis not present

## 2022-10-21 DIAGNOSIS — D696 Thrombocytopenia, unspecified: Secondary | ICD-10-CM | POA: Diagnosis not present

## 2022-10-25 DIAGNOSIS — B259 Cytomegaloviral disease, unspecified: Secondary | ICD-10-CM | POA: Diagnosis not present

## 2022-10-25 DIAGNOSIS — D696 Thrombocytopenia, unspecified: Secondary | ICD-10-CM | POA: Diagnosis not present

## 2022-10-25 DIAGNOSIS — I2510 Atherosclerotic heart disease of native coronary artery without angina pectoris: Secondary | ICD-10-CM | POA: Diagnosis not present

## 2022-10-25 DIAGNOSIS — K219 Gastro-esophageal reflux disease without esophagitis: Secondary | ICD-10-CM | POA: Diagnosis not present

## 2022-10-25 DIAGNOSIS — K2970 Gastritis, unspecified, without bleeding: Secondary | ICD-10-CM | POA: Diagnosis not present

## 2022-10-25 DIAGNOSIS — I10 Essential (primary) hypertension: Secondary | ICD-10-CM | POA: Diagnosis not present

## 2022-10-25 DIAGNOSIS — J8410 Pulmonary fibrosis, unspecified: Secondary | ICD-10-CM | POA: Diagnosis not present

## 2022-10-25 DIAGNOSIS — E119 Type 2 diabetes mellitus without complications: Secondary | ICD-10-CM | POA: Diagnosis not present

## 2022-10-25 DIAGNOSIS — G9389 Other specified disorders of brain: Secondary | ICD-10-CM | POA: Diagnosis not present

## 2022-10-27 DIAGNOSIS — Z944 Liver transplant status: Secondary | ICD-10-CM | POA: Diagnosis not present

## 2022-10-27 DIAGNOSIS — Z79899 Other long term (current) drug therapy: Secondary | ICD-10-CM | POA: Diagnosis not present

## 2022-10-27 DIAGNOSIS — B259 Cytomegaloviral disease, unspecified: Secondary | ICD-10-CM | POA: Diagnosis not present

## 2022-10-27 DIAGNOSIS — R634 Abnormal weight loss: Secondary | ICD-10-CM | POA: Diagnosis not present

## 2022-10-27 DIAGNOSIS — D84821 Immunodeficiency due to drugs: Secondary | ICD-10-CM | POA: Diagnosis not present

## 2022-10-27 DIAGNOSIS — F320 Major depressive disorder, single episode, mild: Secondary | ICD-10-CM | POA: Diagnosis not present

## 2022-10-27 DIAGNOSIS — E1165 Type 2 diabetes mellitus with hyperglycemia: Secondary | ICD-10-CM | POA: Diagnosis not present

## 2022-10-27 DIAGNOSIS — I10 Essential (primary) hypertension: Secondary | ICD-10-CM | POA: Diagnosis not present

## 2022-10-27 DIAGNOSIS — Z794 Long term (current) use of insulin: Secondary | ICD-10-CM | POA: Diagnosis not present

## 2022-10-27 DIAGNOSIS — M6281 Muscle weakness (generalized): Secondary | ICD-10-CM | POA: Diagnosis not present

## 2022-10-27 DIAGNOSIS — Z4823 Encounter for aftercare following liver transplant: Secondary | ICD-10-CM | POA: Diagnosis not present

## 2022-10-28 DIAGNOSIS — D696 Thrombocytopenia, unspecified: Secondary | ICD-10-CM | POA: Diagnosis not present

## 2022-10-28 DIAGNOSIS — I10 Essential (primary) hypertension: Secondary | ICD-10-CM | POA: Diagnosis not present

## 2022-10-28 DIAGNOSIS — I2510 Atherosclerotic heart disease of native coronary artery without angina pectoris: Secondary | ICD-10-CM | POA: Diagnosis not present

## 2022-10-28 DIAGNOSIS — E119 Type 2 diabetes mellitus without complications: Secondary | ICD-10-CM | POA: Diagnosis not present

## 2022-10-28 DIAGNOSIS — K219 Gastro-esophageal reflux disease without esophagitis: Secondary | ICD-10-CM | POA: Diagnosis not present

## 2022-10-28 DIAGNOSIS — K2970 Gastritis, unspecified, without bleeding: Secondary | ICD-10-CM | POA: Diagnosis not present

## 2022-10-28 DIAGNOSIS — J8410 Pulmonary fibrosis, unspecified: Secondary | ICD-10-CM | POA: Diagnosis not present

## 2022-10-28 DIAGNOSIS — B259 Cytomegaloviral disease, unspecified: Secondary | ICD-10-CM | POA: Diagnosis not present

## 2022-10-28 DIAGNOSIS — G9389 Other specified disorders of brain: Secondary | ICD-10-CM | POA: Diagnosis not present

## 2022-10-30 DIAGNOSIS — B259 Cytomegaloviral disease, unspecified: Secondary | ICD-10-CM | POA: Diagnosis not present

## 2022-10-30 DIAGNOSIS — J8410 Pulmonary fibrosis, unspecified: Secondary | ICD-10-CM | POA: Diagnosis not present

## 2022-10-30 DIAGNOSIS — K2970 Gastritis, unspecified, without bleeding: Secondary | ICD-10-CM | POA: Diagnosis not present

## 2022-10-30 DIAGNOSIS — G9389 Other specified disorders of brain: Secondary | ICD-10-CM | POA: Diagnosis not present

## 2022-10-30 DIAGNOSIS — I10 Essential (primary) hypertension: Secondary | ICD-10-CM | POA: Diagnosis not present

## 2022-10-30 DIAGNOSIS — K219 Gastro-esophageal reflux disease without esophagitis: Secondary | ICD-10-CM | POA: Diagnosis not present

## 2022-10-30 DIAGNOSIS — E119 Type 2 diabetes mellitus without complications: Secondary | ICD-10-CM | POA: Diagnosis not present

## 2022-10-30 DIAGNOSIS — D696 Thrombocytopenia, unspecified: Secondary | ICD-10-CM | POA: Diagnosis not present

## 2022-10-30 DIAGNOSIS — I2510 Atherosclerotic heart disease of native coronary artery without angina pectoris: Secondary | ICD-10-CM | POA: Diagnosis not present

## 2022-11-04 DIAGNOSIS — D696 Thrombocytopenia, unspecified: Secondary | ICD-10-CM | POA: Diagnosis not present

## 2022-11-04 DIAGNOSIS — I10 Essential (primary) hypertension: Secondary | ICD-10-CM | POA: Diagnosis not present

## 2022-11-04 DIAGNOSIS — K219 Gastro-esophageal reflux disease without esophagitis: Secondary | ICD-10-CM | POA: Diagnosis not present

## 2022-11-04 DIAGNOSIS — I2510 Atherosclerotic heart disease of native coronary artery without angina pectoris: Secondary | ICD-10-CM | POA: Diagnosis not present

## 2022-11-04 DIAGNOSIS — B259 Cytomegaloviral disease, unspecified: Secondary | ICD-10-CM | POA: Diagnosis not present

## 2022-11-04 DIAGNOSIS — J8410 Pulmonary fibrosis, unspecified: Secondary | ICD-10-CM | POA: Diagnosis not present

## 2022-11-04 DIAGNOSIS — K2970 Gastritis, unspecified, without bleeding: Secondary | ICD-10-CM | POA: Diagnosis not present

## 2022-11-04 DIAGNOSIS — G9389 Other specified disorders of brain: Secondary | ICD-10-CM | POA: Diagnosis not present

## 2022-11-04 DIAGNOSIS — E119 Type 2 diabetes mellitus without complications: Secondary | ICD-10-CM | POA: Diagnosis not present

## 2022-11-05 DIAGNOSIS — B459 Cryptococcosis, unspecified: Secondary | ICD-10-CM | POA: Diagnosis not present

## 2022-11-05 DIAGNOSIS — Z23 Encounter for immunization: Secondary | ICD-10-CM | POA: Diagnosis not present

## 2022-11-05 DIAGNOSIS — Z8619 Personal history of other infectious and parasitic diseases: Secondary | ICD-10-CM | POA: Diagnosis not present

## 2022-11-05 DIAGNOSIS — Z944 Liver transplant status: Secondary | ICD-10-CM | POA: Diagnosis not present

## 2022-11-05 DIAGNOSIS — R918 Other nonspecific abnormal finding of lung field: Secondary | ICD-10-CM | POA: Diagnosis not present

## 2022-11-10 DIAGNOSIS — J8410 Pulmonary fibrosis, unspecified: Secondary | ICD-10-CM | POA: Diagnosis not present

## 2022-11-10 DIAGNOSIS — I10 Essential (primary) hypertension: Secondary | ICD-10-CM | POA: Diagnosis not present

## 2022-11-10 DIAGNOSIS — B259 Cytomegaloviral disease, unspecified: Secondary | ICD-10-CM | POA: Diagnosis not present

## 2022-11-10 DIAGNOSIS — I2510 Atherosclerotic heart disease of native coronary artery without angina pectoris: Secondary | ICD-10-CM | POA: Diagnosis not present

## 2022-11-10 DIAGNOSIS — Z79899 Other long term (current) drug therapy: Secondary | ICD-10-CM | POA: Diagnosis not present

## 2022-11-10 DIAGNOSIS — D84821 Immunodeficiency due to drugs: Secondary | ICD-10-CM | POA: Diagnosis not present

## 2022-11-10 DIAGNOSIS — E119 Type 2 diabetes mellitus without complications: Secondary | ICD-10-CM | POA: Diagnosis not present

## 2022-11-10 DIAGNOSIS — E1165 Type 2 diabetes mellitus with hyperglycemia: Secondary | ICD-10-CM | POA: Diagnosis not present

## 2022-11-10 DIAGNOSIS — K219 Gastro-esophageal reflux disease without esophagitis: Secondary | ICD-10-CM | POA: Diagnosis not present

## 2022-11-10 DIAGNOSIS — D696 Thrombocytopenia, unspecified: Secondary | ICD-10-CM | POA: Diagnosis not present

## 2022-11-10 DIAGNOSIS — G9389 Other specified disorders of brain: Secondary | ICD-10-CM | POA: Diagnosis not present

## 2022-11-10 DIAGNOSIS — Z4823 Encounter for aftercare following liver transplant: Secondary | ICD-10-CM | POA: Diagnosis not present

## 2022-11-10 DIAGNOSIS — K2970 Gastritis, unspecified, without bleeding: Secondary | ICD-10-CM | POA: Diagnosis not present

## 2022-11-10 DIAGNOSIS — Z944 Liver transplant status: Secondary | ICD-10-CM | POA: Diagnosis not present

## 2022-11-17 DIAGNOSIS — R918 Other nonspecific abnormal finding of lung field: Secondary | ICD-10-CM | POA: Diagnosis not present

## 2022-11-18 DIAGNOSIS — K2970 Gastritis, unspecified, without bleeding: Secondary | ICD-10-CM | POA: Diagnosis not present

## 2022-11-18 DIAGNOSIS — J8410 Pulmonary fibrosis, unspecified: Secondary | ICD-10-CM | POA: Diagnosis not present

## 2022-11-18 DIAGNOSIS — E119 Type 2 diabetes mellitus without complications: Secondary | ICD-10-CM | POA: Diagnosis not present

## 2022-11-18 DIAGNOSIS — D696 Thrombocytopenia, unspecified: Secondary | ICD-10-CM | POA: Diagnosis not present

## 2022-11-18 DIAGNOSIS — I2510 Atherosclerotic heart disease of native coronary artery without angina pectoris: Secondary | ICD-10-CM | POA: Diagnosis not present

## 2022-11-18 DIAGNOSIS — K219 Gastro-esophageal reflux disease without esophagitis: Secondary | ICD-10-CM | POA: Diagnosis not present

## 2022-11-18 DIAGNOSIS — B259 Cytomegaloviral disease, unspecified: Secondary | ICD-10-CM | POA: Diagnosis not present

## 2022-11-18 DIAGNOSIS — I10 Essential (primary) hypertension: Secondary | ICD-10-CM | POA: Diagnosis not present

## 2022-11-18 DIAGNOSIS — G9389 Other specified disorders of brain: Secondary | ICD-10-CM | POA: Diagnosis not present

## 2022-11-20 DIAGNOSIS — D696 Thrombocytopenia, unspecified: Secondary | ICD-10-CM | POA: Diagnosis not present

## 2022-11-20 DIAGNOSIS — K219 Gastro-esophageal reflux disease without esophagitis: Secondary | ICD-10-CM | POA: Diagnosis not present

## 2022-11-20 DIAGNOSIS — E119 Type 2 diabetes mellitus without complications: Secondary | ICD-10-CM | POA: Diagnosis not present

## 2022-11-20 DIAGNOSIS — J8410 Pulmonary fibrosis, unspecified: Secondary | ICD-10-CM | POA: Diagnosis not present

## 2022-11-20 DIAGNOSIS — G9389 Other specified disorders of brain: Secondary | ICD-10-CM | POA: Diagnosis not present

## 2022-11-20 DIAGNOSIS — I10 Essential (primary) hypertension: Secondary | ICD-10-CM | POA: Diagnosis not present

## 2022-11-20 DIAGNOSIS — I2510 Atherosclerotic heart disease of native coronary artery without angina pectoris: Secondary | ICD-10-CM | POA: Diagnosis not present

## 2022-11-20 DIAGNOSIS — K2970 Gastritis, unspecified, without bleeding: Secondary | ICD-10-CM | POA: Diagnosis not present

## 2022-11-20 DIAGNOSIS — B259 Cytomegaloviral disease, unspecified: Secondary | ICD-10-CM | POA: Diagnosis not present

## 2022-11-24 DIAGNOSIS — K219 Gastro-esophageal reflux disease without esophagitis: Secondary | ICD-10-CM | POA: Diagnosis not present

## 2022-11-24 DIAGNOSIS — B259 Cytomegaloviral disease, unspecified: Secondary | ICD-10-CM | POA: Diagnosis not present

## 2022-11-24 DIAGNOSIS — D84821 Immunodeficiency due to drugs: Secondary | ICD-10-CM | POA: Diagnosis not present

## 2022-11-24 DIAGNOSIS — G9389 Other specified disorders of brain: Secondary | ICD-10-CM | POA: Diagnosis not present

## 2022-11-24 DIAGNOSIS — Z79899 Other long term (current) drug therapy: Secondary | ICD-10-CM | POA: Diagnosis not present

## 2022-11-24 DIAGNOSIS — I10 Essential (primary) hypertension: Secondary | ICD-10-CM | POA: Diagnosis not present

## 2022-11-24 DIAGNOSIS — E119 Type 2 diabetes mellitus without complications: Secondary | ICD-10-CM | POA: Diagnosis not present

## 2022-11-24 DIAGNOSIS — K2970 Gastritis, unspecified, without bleeding: Secondary | ICD-10-CM | POA: Diagnosis not present

## 2022-11-24 DIAGNOSIS — J8410 Pulmonary fibrosis, unspecified: Secondary | ICD-10-CM | POA: Diagnosis not present

## 2022-11-24 DIAGNOSIS — D696 Thrombocytopenia, unspecified: Secondary | ICD-10-CM | POA: Diagnosis not present

## 2022-11-24 DIAGNOSIS — Z944 Liver transplant status: Secondary | ICD-10-CM | POA: Diagnosis not present

## 2022-11-24 DIAGNOSIS — Z4823 Encounter for aftercare following liver transplant: Secondary | ICD-10-CM | POA: Diagnosis not present

## 2022-11-24 DIAGNOSIS — I2510 Atherosclerotic heart disease of native coronary artery without angina pectoris: Secondary | ICD-10-CM | POA: Diagnosis not present

## 2022-11-27 DIAGNOSIS — F320 Major depressive disorder, single episode, mild: Secondary | ICD-10-CM | POA: Diagnosis not present

## 2022-11-27 DIAGNOSIS — G9389 Other specified disorders of brain: Secondary | ICD-10-CM | POA: Diagnosis not present

## 2022-11-27 DIAGNOSIS — I10 Essential (primary) hypertension: Secondary | ICD-10-CM | POA: Diagnosis not present

## 2022-11-27 DIAGNOSIS — Z794 Long term (current) use of insulin: Secondary | ICD-10-CM | POA: Diagnosis not present

## 2022-11-27 DIAGNOSIS — Z944 Liver transplant status: Secondary | ICD-10-CM | POA: Diagnosis not present

## 2022-11-27 DIAGNOSIS — K219 Gastro-esophageal reflux disease without esophagitis: Secondary | ICD-10-CM | POA: Diagnosis not present

## 2022-11-27 DIAGNOSIS — D696 Thrombocytopenia, unspecified: Secondary | ICD-10-CM | POA: Diagnosis not present

## 2022-11-27 DIAGNOSIS — K2970 Gastritis, unspecified, without bleeding: Secondary | ICD-10-CM | POA: Diagnosis not present

## 2022-11-27 DIAGNOSIS — B259 Cytomegaloviral disease, unspecified: Secondary | ICD-10-CM | POA: Diagnosis not present

## 2022-11-27 DIAGNOSIS — E1165 Type 2 diabetes mellitus with hyperglycemia: Secondary | ICD-10-CM | POA: Diagnosis not present

## 2022-11-27 DIAGNOSIS — J8410 Pulmonary fibrosis, unspecified: Secondary | ICD-10-CM | POA: Diagnosis not present

## 2022-11-27 DIAGNOSIS — R634 Abnormal weight loss: Secondary | ICD-10-CM | POA: Diagnosis not present

## 2022-11-27 DIAGNOSIS — I2510 Atherosclerotic heart disease of native coronary artery without angina pectoris: Secondary | ICD-10-CM | POA: Diagnosis not present

## 2022-11-27 DIAGNOSIS — E119 Type 2 diabetes mellitus without complications: Secondary | ICD-10-CM | POA: Diagnosis not present

## 2022-11-27 DIAGNOSIS — M6281 Muscle weakness (generalized): Secondary | ICD-10-CM | POA: Diagnosis not present

## 2022-11-27 DIAGNOSIS — Z6829 Body mass index (BMI) 29.0-29.9, adult: Secondary | ICD-10-CM | POA: Diagnosis not present

## 2022-12-08 DIAGNOSIS — D84821 Immunodeficiency due to drugs: Secondary | ICD-10-CM | POA: Diagnosis not present

## 2022-12-08 DIAGNOSIS — Z944 Liver transplant status: Secondary | ICD-10-CM | POA: Diagnosis not present

## 2022-12-08 DIAGNOSIS — Z79899 Other long term (current) drug therapy: Secondary | ICD-10-CM | POA: Diagnosis not present

## 2022-12-08 DIAGNOSIS — Z4823 Encounter for aftercare following liver transplant: Secondary | ICD-10-CM | POA: Diagnosis not present

## 2022-12-15 DIAGNOSIS — N401 Enlarged prostate with lower urinary tract symptoms: Secondary | ICD-10-CM | POA: Diagnosis not present

## 2022-12-15 DIAGNOSIS — Z4823 Encounter for aftercare following liver transplant: Secondary | ICD-10-CM | POA: Diagnosis not present

## 2022-12-15 DIAGNOSIS — Z79899 Other long term (current) drug therapy: Secondary | ICD-10-CM | POA: Diagnosis not present

## 2022-12-15 DIAGNOSIS — D84821 Immunodeficiency due to drugs: Secondary | ICD-10-CM | POA: Diagnosis not present

## 2022-12-15 DIAGNOSIS — Z944 Liver transplant status: Secondary | ICD-10-CM | POA: Diagnosis not present

## 2022-12-16 DIAGNOSIS — B459 Cryptococcosis, unspecified: Secondary | ICD-10-CM | POA: Diagnosis not present

## 2022-12-16 DIAGNOSIS — Z944 Liver transplant status: Secondary | ICD-10-CM | POA: Diagnosis not present

## 2022-12-16 DIAGNOSIS — D849 Immunodeficiency, unspecified: Secondary | ICD-10-CM | POA: Diagnosis not present

## 2022-12-16 DIAGNOSIS — C220 Liver cell carcinoma: Secondary | ICD-10-CM | POA: Diagnosis not present

## 2022-12-22 DIAGNOSIS — N401 Enlarged prostate with lower urinary tract symptoms: Secondary | ICD-10-CM | POA: Diagnosis not present

## 2022-12-22 DIAGNOSIS — N200 Calculus of kidney: Secondary | ICD-10-CM | POA: Diagnosis not present

## 2022-12-22 DIAGNOSIS — R3912 Poor urinary stream: Secondary | ICD-10-CM | POA: Diagnosis not present

## 2022-12-22 DIAGNOSIS — N5201 Erectile dysfunction due to arterial insufficiency: Secondary | ICD-10-CM | POA: Diagnosis not present

## 2022-12-23 DIAGNOSIS — D225 Melanocytic nevi of trunk: Secondary | ICD-10-CM | POA: Diagnosis not present

## 2022-12-23 DIAGNOSIS — L821 Other seborrheic keratosis: Secondary | ICD-10-CM | POA: Diagnosis not present

## 2022-12-23 DIAGNOSIS — D2239 Melanocytic nevi of other parts of face: Secondary | ICD-10-CM | POA: Diagnosis not present

## 2022-12-23 DIAGNOSIS — L814 Other melanin hyperpigmentation: Secondary | ICD-10-CM | POA: Diagnosis not present

## 2022-12-29 DIAGNOSIS — Z944 Liver transplant status: Secondary | ICD-10-CM | POA: Diagnosis not present

## 2022-12-29 DIAGNOSIS — Z79899 Other long term (current) drug therapy: Secondary | ICD-10-CM | POA: Diagnosis not present

## 2022-12-29 DIAGNOSIS — D84821 Immunodeficiency due to drugs: Secondary | ICD-10-CM | POA: Diagnosis not present

## 2022-12-29 DIAGNOSIS — Z4823 Encounter for aftercare following liver transplant: Secondary | ICD-10-CM | POA: Diagnosis not present

## 2023-01-07 DIAGNOSIS — Z8505 Personal history of malignant neoplasm of liver: Secondary | ICD-10-CM | POA: Diagnosis not present

## 2023-01-07 DIAGNOSIS — R911 Solitary pulmonary nodule: Secondary | ICD-10-CM | POA: Diagnosis not present

## 2023-01-07 DIAGNOSIS — B459 Cryptococcosis, unspecified: Secondary | ICD-10-CM | POA: Diagnosis not present

## 2023-01-07 DIAGNOSIS — Z944 Liver transplant status: Secondary | ICD-10-CM | POA: Diagnosis not present

## 2023-01-07 DIAGNOSIS — N3289 Other specified disorders of bladder: Secondary | ICD-10-CM | POA: Diagnosis not present

## 2023-01-07 DIAGNOSIS — Z08 Encounter for follow-up examination after completed treatment for malignant neoplasm: Secondary | ICD-10-CM | POA: Diagnosis not present

## 2023-01-07 DIAGNOSIS — C220 Liver cell carcinoma: Secondary | ICD-10-CM | POA: Diagnosis not present

## 2023-01-12 DIAGNOSIS — D84821 Immunodeficiency due to drugs: Secondary | ICD-10-CM | POA: Diagnosis not present

## 2023-01-12 DIAGNOSIS — Z944 Liver transplant status: Secondary | ICD-10-CM | POA: Diagnosis not present

## 2023-01-12 DIAGNOSIS — Z79899 Other long term (current) drug therapy: Secondary | ICD-10-CM | POA: Diagnosis not present

## 2023-01-12 DIAGNOSIS — Z4823 Encounter for aftercare following liver transplant: Secondary | ICD-10-CM | POA: Diagnosis not present

## 2023-01-26 DIAGNOSIS — Z4823 Encounter for aftercare following liver transplant: Secondary | ICD-10-CM | POA: Diagnosis not present

## 2023-01-26 DIAGNOSIS — Z79899 Other long term (current) drug therapy: Secondary | ICD-10-CM | POA: Diagnosis not present

## 2023-01-26 DIAGNOSIS — Z944 Liver transplant status: Secondary | ICD-10-CM | POA: Diagnosis not present

## 2023-01-26 DIAGNOSIS — D84821 Immunodeficiency due to drugs: Secondary | ICD-10-CM | POA: Diagnosis not present

## 2023-02-09 DIAGNOSIS — D84821 Immunodeficiency due to drugs: Secondary | ICD-10-CM | POA: Diagnosis not present

## 2023-02-09 DIAGNOSIS — Z79899 Other long term (current) drug therapy: Secondary | ICD-10-CM | POA: Diagnosis not present

## 2023-02-09 DIAGNOSIS — Z944 Liver transplant status: Secondary | ICD-10-CM | POA: Diagnosis not present

## 2023-02-09 DIAGNOSIS — Z4823 Encounter for aftercare following liver transplant: Secondary | ICD-10-CM | POA: Diagnosis not present

## 2023-02-23 DIAGNOSIS — Z79899 Other long term (current) drug therapy: Secondary | ICD-10-CM | POA: Diagnosis not present

## 2023-02-23 DIAGNOSIS — Z4823 Encounter for aftercare following liver transplant: Secondary | ICD-10-CM | POA: Diagnosis not present

## 2023-02-23 DIAGNOSIS — D84821 Immunodeficiency due to drugs: Secondary | ICD-10-CM | POA: Diagnosis not present

## 2023-02-23 DIAGNOSIS — Z944 Liver transplant status: Secondary | ICD-10-CM | POA: Diagnosis not present

## 2023-03-10 DIAGNOSIS — R634 Abnormal weight loss: Secondary | ICD-10-CM | POA: Diagnosis not present

## 2023-03-10 DIAGNOSIS — I10 Essential (primary) hypertension: Secondary | ICD-10-CM | POA: Diagnosis not present

## 2023-03-10 DIAGNOSIS — Z944 Liver transplant status: Secondary | ICD-10-CM | POA: Diagnosis not present

## 2023-03-10 DIAGNOSIS — G4733 Obstructive sleep apnea (adult) (pediatric): Secondary | ICD-10-CM | POA: Diagnosis not present

## 2023-03-10 DIAGNOSIS — R21 Rash and other nonspecific skin eruption: Secondary | ICD-10-CM | POA: Diagnosis not present

## 2023-03-10 DIAGNOSIS — M1990 Unspecified osteoarthritis, unspecified site: Secondary | ICD-10-CM | POA: Diagnosis not present

## 2023-03-10 DIAGNOSIS — Z794 Long term (current) use of insulin: Secondary | ICD-10-CM | POA: Diagnosis not present

## 2023-03-10 DIAGNOSIS — E1165 Type 2 diabetes mellitus with hyperglycemia: Secondary | ICD-10-CM | POA: Diagnosis not present

## 2023-03-10 DIAGNOSIS — E039 Hypothyroidism, unspecified: Secondary | ICD-10-CM | POA: Diagnosis not present

## 2023-03-10 DIAGNOSIS — E785 Hyperlipidemia, unspecified: Secondary | ICD-10-CM | POA: Diagnosis not present

## 2023-03-10 DIAGNOSIS — R600 Localized edema: Secondary | ICD-10-CM | POA: Diagnosis not present

## 2023-03-10 DIAGNOSIS — F320 Major depressive disorder, single episode, mild: Secondary | ICD-10-CM | POA: Diagnosis not present

## 2023-03-10 DIAGNOSIS — Z6834 Body mass index (BMI) 34.0-34.9, adult: Secondary | ICD-10-CM | POA: Diagnosis not present

## 2023-03-19 DIAGNOSIS — Z794 Long term (current) use of insulin: Secondary | ICD-10-CM | POA: Diagnosis not present

## 2023-03-19 DIAGNOSIS — H35371 Puckering of macula, right eye: Secondary | ICD-10-CM | POA: Diagnosis not present

## 2023-03-19 DIAGNOSIS — H524 Presbyopia: Secondary | ICD-10-CM | POA: Diagnosis not present

## 2023-03-19 DIAGNOSIS — H01009 Unspecified blepharitis unspecified eye, unspecified eyelid: Secondary | ICD-10-CM | POA: Diagnosis not present

## 2023-03-19 DIAGNOSIS — H35013 Changes in retinal vascular appearance, bilateral: Secondary | ICD-10-CM | POA: Diagnosis not present

## 2023-03-19 DIAGNOSIS — E113293 Type 2 diabetes mellitus with mild nonproliferative diabetic retinopathy without macular edema, bilateral: Secondary | ICD-10-CM | POA: Diagnosis not present

## 2023-03-23 DIAGNOSIS — M1712 Unilateral primary osteoarthritis, left knee: Secondary | ICD-10-CM | POA: Diagnosis not present

## 2023-03-23 DIAGNOSIS — M1711 Unilateral primary osteoarthritis, right knee: Secondary | ICD-10-CM | POA: Diagnosis not present

## 2023-03-30 DIAGNOSIS — Z4823 Encounter for aftercare following liver transplant: Secondary | ICD-10-CM | POA: Diagnosis not present

## 2023-03-30 DIAGNOSIS — Z944 Liver transplant status: Secondary | ICD-10-CM | POA: Diagnosis not present

## 2023-03-30 DIAGNOSIS — Z79899 Other long term (current) drug therapy: Secondary | ICD-10-CM | POA: Diagnosis not present

## 2023-03-30 DIAGNOSIS — D84821 Immunodeficiency due to drugs: Secondary | ICD-10-CM | POA: Diagnosis not present

## 2023-04-08 DIAGNOSIS — I700 Atherosclerosis of aorta: Secondary | ICD-10-CM | POA: Diagnosis not present

## 2023-04-08 DIAGNOSIS — I2510 Atherosclerotic heart disease of native coronary artery without angina pectoris: Secondary | ICD-10-CM | POA: Diagnosis not present

## 2023-04-08 DIAGNOSIS — R918 Other nonspecific abnormal finding of lung field: Secondary | ICD-10-CM | POA: Diagnosis not present

## 2023-04-09 DIAGNOSIS — B450 Pulmonary cryptococcosis: Secondary | ICD-10-CM | POA: Diagnosis not present

## 2023-04-09 DIAGNOSIS — Z944 Liver transplant status: Secondary | ICD-10-CM | POA: Diagnosis not present

## 2023-04-16 DIAGNOSIS — J019 Acute sinusitis, unspecified: Secondary | ICD-10-CM | POA: Diagnosis not present

## 2023-04-16 DIAGNOSIS — B9689 Other specified bacterial agents as the cause of diseases classified elsewhere: Secondary | ICD-10-CM | POA: Diagnosis not present

## 2023-04-27 DIAGNOSIS — Z79899 Other long term (current) drug therapy: Secondary | ICD-10-CM | POA: Diagnosis not present

## 2023-04-27 DIAGNOSIS — D84821 Immunodeficiency due to drugs: Secondary | ICD-10-CM | POA: Diagnosis not present

## 2023-04-27 DIAGNOSIS — Z944 Liver transplant status: Secondary | ICD-10-CM | POA: Diagnosis not present

## 2023-04-27 DIAGNOSIS — Z4823 Encounter for aftercare following liver transplant: Secondary | ICD-10-CM | POA: Diagnosis not present

## 2023-05-18 DIAGNOSIS — Z79899 Other long term (current) drug therapy: Secondary | ICD-10-CM | POA: Diagnosis not present

## 2023-05-18 DIAGNOSIS — Z4823 Encounter for aftercare following liver transplant: Secondary | ICD-10-CM | POA: Diagnosis not present

## 2023-05-18 DIAGNOSIS — D84821 Immunodeficiency due to drugs: Secondary | ICD-10-CM | POA: Diagnosis not present

## 2023-05-18 DIAGNOSIS — Z944 Liver transplant status: Secondary | ICD-10-CM | POA: Diagnosis not present

## 2023-05-26 DIAGNOSIS — K760 Fatty (change of) liver, not elsewhere classified: Secondary | ICD-10-CM | POA: Diagnosis not present

## 2023-05-26 DIAGNOSIS — Z4823 Encounter for aftercare following liver transplant: Secondary | ICD-10-CM | POA: Diagnosis not present

## 2023-05-26 DIAGNOSIS — R7989 Other specified abnormal findings of blood chemistry: Secondary | ICD-10-CM | POA: Diagnosis not present

## 2023-05-26 DIAGNOSIS — Z944 Liver transplant status: Secondary | ICD-10-CM | POA: Diagnosis not present

## 2023-06-08 DIAGNOSIS — D84821 Immunodeficiency due to drugs: Secondary | ICD-10-CM | POA: Diagnosis not present

## 2023-06-08 DIAGNOSIS — Z79899 Other long term (current) drug therapy: Secondary | ICD-10-CM | POA: Diagnosis not present

## 2023-06-08 DIAGNOSIS — Z4823 Encounter for aftercare following liver transplant: Secondary | ICD-10-CM | POA: Diagnosis not present

## 2023-06-08 DIAGNOSIS — Z944 Liver transplant status: Secondary | ICD-10-CM | POA: Diagnosis not present

## 2023-06-09 DIAGNOSIS — E113291 Type 2 diabetes mellitus with mild nonproliferative diabetic retinopathy without macular edema, right eye: Secondary | ICD-10-CM | POA: Diagnosis not present

## 2023-06-09 DIAGNOSIS — E113212 Type 2 diabetes mellitus with mild nonproliferative diabetic retinopathy with macular edema, left eye: Secondary | ICD-10-CM | POA: Diagnosis not present

## 2023-06-09 DIAGNOSIS — H35013 Changes in retinal vascular appearance, bilateral: Secondary | ICD-10-CM | POA: Diagnosis not present

## 2023-06-09 DIAGNOSIS — H524 Presbyopia: Secondary | ICD-10-CM | POA: Diagnosis not present

## 2023-06-09 DIAGNOSIS — H35371 Puckering of macula, right eye: Secondary | ICD-10-CM | POA: Diagnosis not present

## 2023-06-09 DIAGNOSIS — H01009 Unspecified blepharitis unspecified eye, unspecified eyelid: Secondary | ICD-10-CM | POA: Diagnosis not present

## 2023-06-09 DIAGNOSIS — Z794 Long term (current) use of insulin: Secondary | ICD-10-CM | POA: Diagnosis not present

## 2023-06-11 DIAGNOSIS — R600 Localized edema: Secondary | ICD-10-CM | POA: Diagnosis not present

## 2023-06-11 DIAGNOSIS — G4733 Obstructive sleep apnea (adult) (pediatric): Secondary | ICD-10-CM | POA: Diagnosis not present

## 2023-06-11 DIAGNOSIS — Z944 Liver transplant status: Secondary | ICD-10-CM | POA: Diagnosis not present

## 2023-06-11 DIAGNOSIS — Z961 Presence of intraocular lens: Secondary | ICD-10-CM | POA: Diagnosis not present

## 2023-06-11 DIAGNOSIS — E113291 Type 2 diabetes mellitus with mild nonproliferative diabetic retinopathy without macular edema, right eye: Secondary | ICD-10-CM | POA: Diagnosis not present

## 2023-06-11 DIAGNOSIS — H35033 Hypertensive retinopathy, bilateral: Secondary | ICD-10-CM | POA: Diagnosis not present

## 2023-06-11 DIAGNOSIS — I10 Essential (primary) hypertension: Secondary | ICD-10-CM | POA: Diagnosis not present

## 2023-06-11 DIAGNOSIS — E785 Hyperlipidemia, unspecified: Secondary | ICD-10-CM | POA: Diagnosis not present

## 2023-06-11 DIAGNOSIS — M1990 Unspecified osteoarthritis, unspecified site: Secondary | ICD-10-CM | POA: Diagnosis not present

## 2023-06-11 DIAGNOSIS — Z794 Long term (current) use of insulin: Secondary | ICD-10-CM | POA: Diagnosis not present

## 2023-06-11 DIAGNOSIS — E113212 Type 2 diabetes mellitus with mild nonproliferative diabetic retinopathy with macular edema, left eye: Secondary | ICD-10-CM | POA: Diagnosis not present

## 2023-06-11 DIAGNOSIS — E1165 Type 2 diabetes mellitus with hyperglycemia: Secondary | ICD-10-CM | POA: Diagnosis not present

## 2023-06-11 DIAGNOSIS — H43821 Vitreomacular adhesion, right eye: Secondary | ICD-10-CM | POA: Diagnosis not present

## 2023-06-11 DIAGNOSIS — F320 Major depressive disorder, single episode, mild: Secondary | ICD-10-CM | POA: Diagnosis not present

## 2023-06-22 DIAGNOSIS — Z944 Liver transplant status: Secondary | ICD-10-CM | POA: Diagnosis not present

## 2023-06-22 DIAGNOSIS — Z4823 Encounter for aftercare following liver transplant: Secondary | ICD-10-CM | POA: Diagnosis not present

## 2023-06-22 DIAGNOSIS — D84821 Immunodeficiency due to drugs: Secondary | ICD-10-CM | POA: Diagnosis not present

## 2023-06-22 DIAGNOSIS — T451X5A Adverse effect of antineoplastic and immunosuppressive drugs, initial encounter: Secondary | ICD-10-CM | POA: Diagnosis not present

## 2023-06-22 DIAGNOSIS — Z7960 Long term (current) use of unspecified immunomodulators and immunosuppressants: Secondary | ICD-10-CM | POA: Diagnosis not present

## 2023-06-23 DIAGNOSIS — L821 Other seborrheic keratosis: Secondary | ICD-10-CM | POA: Diagnosis not present

## 2023-06-23 DIAGNOSIS — L814 Other melanin hyperpigmentation: Secondary | ICD-10-CM | POA: Diagnosis not present

## 2023-06-23 DIAGNOSIS — L570 Actinic keratosis: Secondary | ICD-10-CM | POA: Diagnosis not present

## 2023-06-23 DIAGNOSIS — D225 Melanocytic nevi of trunk: Secondary | ICD-10-CM | POA: Diagnosis not present

## 2023-06-23 DIAGNOSIS — D2239 Melanocytic nevi of other parts of face: Secondary | ICD-10-CM | POA: Diagnosis not present

## 2023-06-24 DIAGNOSIS — N200 Calculus of kidney: Secondary | ICD-10-CM | POA: Diagnosis not present

## 2023-06-24 DIAGNOSIS — N401 Enlarged prostate with lower urinary tract symptoms: Secondary | ICD-10-CM | POA: Diagnosis not present

## 2023-06-24 DIAGNOSIS — R3912 Poor urinary stream: Secondary | ICD-10-CM | POA: Diagnosis not present

## 2023-06-24 DIAGNOSIS — N5201 Erectile dysfunction due to arterial insufficiency: Secondary | ICD-10-CM | POA: Diagnosis not present

## 2023-07-06 DIAGNOSIS — Z4823 Encounter for aftercare following liver transplant: Secondary | ICD-10-CM | POA: Diagnosis not present

## 2023-07-06 DIAGNOSIS — D84821 Immunodeficiency due to drugs: Secondary | ICD-10-CM | POA: Diagnosis not present

## 2023-07-06 DIAGNOSIS — Z944 Liver transplant status: Secondary | ICD-10-CM | POA: Diagnosis not present

## 2023-07-06 DIAGNOSIS — Z79899 Other long term (current) drug therapy: Secondary | ICD-10-CM | POA: Diagnosis not present

## 2023-07-20 DIAGNOSIS — Z8505 Personal history of malignant neoplasm of liver: Secondary | ICD-10-CM | POA: Diagnosis not present

## 2023-07-20 DIAGNOSIS — Z944 Liver transplant status: Secondary | ICD-10-CM | POA: Diagnosis not present

## 2023-07-20 DIAGNOSIS — Z4823 Encounter for aftercare following liver transplant: Secondary | ICD-10-CM | POA: Diagnosis not present

## 2023-07-20 DIAGNOSIS — D84821 Immunodeficiency due to drugs: Secondary | ICD-10-CM | POA: Diagnosis not present

## 2023-07-20 DIAGNOSIS — Z79899 Other long term (current) drug therapy: Secondary | ICD-10-CM | POA: Diagnosis not present

## 2023-08-17 DIAGNOSIS — Z79899 Other long term (current) drug therapy: Secondary | ICD-10-CM | POA: Diagnosis not present

## 2023-08-17 DIAGNOSIS — Z944 Liver transplant status: Secondary | ICD-10-CM | POA: Diagnosis not present

## 2023-08-17 DIAGNOSIS — Z4823 Encounter for aftercare following liver transplant: Secondary | ICD-10-CM | POA: Diagnosis not present

## 2023-09-07 DIAGNOSIS — J019 Acute sinusitis, unspecified: Secondary | ICD-10-CM | POA: Diagnosis not present

## 2023-09-07 DIAGNOSIS — B9689 Other specified bacterial agents as the cause of diseases classified elsewhere: Secondary | ICD-10-CM | POA: Diagnosis not present

## 2023-09-15 DIAGNOSIS — M25561 Pain in right knee: Secondary | ICD-10-CM | POA: Diagnosis not present

## 2023-09-15 DIAGNOSIS — M1712 Unilateral primary osteoarthritis, left knee: Secondary | ICD-10-CM | POA: Diagnosis not present

## 2023-09-15 DIAGNOSIS — M1711 Unilateral primary osteoarthritis, right knee: Secondary | ICD-10-CM | POA: Diagnosis not present

## 2023-09-21 DIAGNOSIS — M1711 Unilateral primary osteoarthritis, right knee: Secondary | ICD-10-CM | POA: Diagnosis not present

## 2023-09-21 DIAGNOSIS — Z79899 Other long term (current) drug therapy: Secondary | ICD-10-CM | POA: Diagnosis not present

## 2023-09-21 DIAGNOSIS — Z4823 Encounter for aftercare following liver transplant: Secondary | ICD-10-CM | POA: Diagnosis not present

## 2023-09-21 DIAGNOSIS — Z944 Liver transplant status: Secondary | ICD-10-CM | POA: Diagnosis not present

## 2023-09-22 DIAGNOSIS — M25561 Pain in right knee: Secondary | ICD-10-CM | POA: Diagnosis not present

## 2023-10-05 DIAGNOSIS — M23321 Other meniscus derangements, posterior horn of medial meniscus, right knee: Secondary | ICD-10-CM | POA: Diagnosis not present

## 2023-10-05 DIAGNOSIS — M1711 Unilateral primary osteoarthritis, right knee: Secondary | ICD-10-CM | POA: Diagnosis not present

## 2023-10-07 DIAGNOSIS — Z944 Liver transplant status: Secondary | ICD-10-CM | POA: Diagnosis not present

## 2023-10-07 DIAGNOSIS — C220 Liver cell carcinoma: Secondary | ICD-10-CM | POA: Diagnosis not present

## 2023-10-07 DIAGNOSIS — Z8505 Personal history of malignant neoplasm of liver: Secondary | ICD-10-CM | POA: Diagnosis not present

## 2023-10-07 DIAGNOSIS — Z4823 Encounter for aftercare following liver transplant: Secondary | ICD-10-CM | POA: Diagnosis not present

## 2023-10-07 DIAGNOSIS — B450 Pulmonary cryptococcosis: Secondary | ICD-10-CM | POA: Diagnosis not present

## 2023-10-16 DIAGNOSIS — E113293 Type 2 diabetes mellitus with mild nonproliferative diabetic retinopathy without macular edema, bilateral: Secondary | ICD-10-CM | POA: Diagnosis not present

## 2023-10-16 DIAGNOSIS — H43821 Vitreomacular adhesion, right eye: Secondary | ICD-10-CM | POA: Diagnosis not present

## 2023-10-16 DIAGNOSIS — Z961 Presence of intraocular lens: Secondary | ICD-10-CM | POA: Diagnosis not present

## 2023-10-16 DIAGNOSIS — H35033 Hypertensive retinopathy, bilateral: Secondary | ICD-10-CM | POA: Diagnosis not present

## 2023-10-19 DIAGNOSIS — Z79899 Other long term (current) drug therapy: Secondary | ICD-10-CM | POA: Diagnosis not present

## 2023-10-19 DIAGNOSIS — Z4823 Encounter for aftercare following liver transplant: Secondary | ICD-10-CM | POA: Diagnosis not present

## 2023-10-19 DIAGNOSIS — Z944 Liver transplant status: Secondary | ICD-10-CM | POA: Diagnosis not present

## 2023-10-29 DIAGNOSIS — L299 Pruritus, unspecified: Secondary | ICD-10-CM | POA: Diagnosis not present

## 2023-10-29 DIAGNOSIS — L570 Actinic keratosis: Secondary | ICD-10-CM | POA: Diagnosis not present

## 2023-10-29 DIAGNOSIS — L578 Other skin changes due to chronic exposure to nonionizing radiation: Secondary | ICD-10-CM | POA: Diagnosis not present

## 2023-10-29 DIAGNOSIS — L719 Rosacea, unspecified: Secondary | ICD-10-CM | POA: Diagnosis not present

## 2023-11-11 DIAGNOSIS — C220 Liver cell carcinoma: Secondary | ICD-10-CM | POA: Diagnosis not present

## 2023-11-11 DIAGNOSIS — Z944 Liver transplant status: Secondary | ICD-10-CM | POA: Diagnosis not present

## 2023-11-11 DIAGNOSIS — R911 Solitary pulmonary nodule: Secondary | ICD-10-CM | POA: Diagnosis not present

## 2023-11-16 DIAGNOSIS — Z944 Liver transplant status: Secondary | ICD-10-CM | POA: Diagnosis not present

## 2023-11-16 DIAGNOSIS — Z79899 Other long term (current) drug therapy: Secondary | ICD-10-CM | POA: Diagnosis not present

## 2023-11-16 DIAGNOSIS — Z4823 Encounter for aftercare following liver transplant: Secondary | ICD-10-CM | POA: Diagnosis not present

## 2023-11-18 DIAGNOSIS — L299 Pruritus, unspecified: Secondary | ICD-10-CM | POA: Diagnosis not present

## 2023-11-18 DIAGNOSIS — L570 Actinic keratosis: Secondary | ICD-10-CM | POA: Diagnosis not present

## 2023-11-18 DIAGNOSIS — L578 Other skin changes due to chronic exposure to nonionizing radiation: Secondary | ICD-10-CM | POA: Diagnosis not present

## 2023-11-18 DIAGNOSIS — L719 Rosacea, unspecified: Secondary | ICD-10-CM | POA: Diagnosis not present

## 2023-11-19 DIAGNOSIS — I10 Essential (primary) hypertension: Secondary | ICD-10-CM | POA: Diagnosis not present

## 2023-11-19 DIAGNOSIS — M1990 Unspecified osteoarthritis, unspecified site: Secondary | ICD-10-CM | POA: Diagnosis not present

## 2023-11-19 DIAGNOSIS — E1165 Type 2 diabetes mellitus with hyperglycemia: Secondary | ICD-10-CM | POA: Diagnosis not present

## 2023-11-19 DIAGNOSIS — E785 Hyperlipidemia, unspecified: Secondary | ICD-10-CM | POA: Diagnosis not present

## 2023-11-19 DIAGNOSIS — R600 Localized edema: Secondary | ICD-10-CM | POA: Diagnosis not present

## 2023-11-19 DIAGNOSIS — Z794 Long term (current) use of insulin: Secondary | ICD-10-CM | POA: Diagnosis not present

## 2023-11-19 DIAGNOSIS — G4733 Obstructive sleep apnea (adult) (pediatric): Secondary | ICD-10-CM | POA: Diagnosis not present

## 2023-11-19 DIAGNOSIS — Z944 Liver transplant status: Secondary | ICD-10-CM | POA: Diagnosis not present

## 2023-11-19 DIAGNOSIS — F320 Major depressive disorder, single episode, mild: Secondary | ICD-10-CM | POA: Diagnosis not present

## 2023-12-15 DIAGNOSIS — C220 Liver cell carcinoma: Secondary | ICD-10-CM | POA: Diagnosis not present

## 2023-12-15 DIAGNOSIS — E66811 Obesity, class 1: Secondary | ICD-10-CM | POA: Diagnosis not present

## 2023-12-15 DIAGNOSIS — B459 Cryptococcosis, unspecified: Secondary | ICD-10-CM | POA: Diagnosis not present

## 2023-12-15 DIAGNOSIS — Z7960 Long term (current) use of unspecified immunomodulators and immunosuppressants: Secondary | ICD-10-CM | POA: Diagnosis not present

## 2023-12-15 DIAGNOSIS — K760 Fatty (change of) liver, not elsewhere classified: Secondary | ICD-10-CM | POA: Diagnosis not present

## 2023-12-15 DIAGNOSIS — T8649 Other complications of liver transplant: Secondary | ICD-10-CM | POA: Diagnosis not present

## 2023-12-15 DIAGNOSIS — D84821 Immunodeficiency due to drugs: Secondary | ICD-10-CM | POA: Diagnosis not present

## 2023-12-15 DIAGNOSIS — Z944 Liver transplant status: Secondary | ICD-10-CM | POA: Diagnosis not present

## 2023-12-17 DIAGNOSIS — E66811 Obesity, class 1: Secondary | ICD-10-CM | POA: Diagnosis not present

## 2023-12-17 DIAGNOSIS — Z6833 Body mass index (BMI) 33.0-33.9, adult: Secondary | ICD-10-CM | POA: Diagnosis not present

## 2023-12-17 DIAGNOSIS — Z794 Long term (current) use of insulin: Secondary | ICD-10-CM | POA: Diagnosis not present

## 2023-12-17 DIAGNOSIS — E1165 Type 2 diabetes mellitus with hyperglycemia: Secondary | ICD-10-CM | POA: Diagnosis not present

## 2023-12-25 DIAGNOSIS — N401 Enlarged prostate with lower urinary tract symptoms: Secondary | ICD-10-CM | POA: Diagnosis not present

## 2023-12-25 DIAGNOSIS — R399 Unspecified symptoms and signs involving the genitourinary system: Secondary | ICD-10-CM | POA: Diagnosis not present

## 2023-12-25 DIAGNOSIS — N5203 Combined arterial insufficiency and corporo-venous occlusive erectile dysfunction: Secondary | ICD-10-CM | POA: Diagnosis not present

## 2023-12-25 DIAGNOSIS — R350 Frequency of micturition: Secondary | ICD-10-CM | POA: Diagnosis not present

## 2023-12-25 DIAGNOSIS — N200 Calculus of kidney: Secondary | ICD-10-CM | POA: Diagnosis not present

## 2023-12-28 DIAGNOSIS — Z4823 Encounter for aftercare following liver transplant: Secondary | ICD-10-CM | POA: Diagnosis not present

## 2023-12-28 DIAGNOSIS — Z944 Liver transplant status: Secondary | ICD-10-CM | POA: Diagnosis not present

## 2023-12-28 DIAGNOSIS — Z79899 Other long term (current) drug therapy: Secondary | ICD-10-CM | POA: Diagnosis not present

## 2024-01-08 DIAGNOSIS — N5201 Erectile dysfunction due to arterial insufficiency: Secondary | ICD-10-CM | POA: Diagnosis not present
